# Patient Record
Sex: Female | Born: 1985
Health system: Southern US, Community
[De-identification: ages and names within clinical notes are randomized; demographics above are authoritative.]

## PROBLEM LIST (undated history)

## (undated) DIAGNOSIS — Z789 Other specified health status: Secondary | ICD-10-CM

## (undated) DIAGNOSIS — Z1501 Genetic susceptibility to malignant neoplasm of breast: Secondary | ICD-10-CM

## (undated) DIAGNOSIS — Z15068 Genetic susceptibility to other malignant neoplasm of digestive system: Secondary | ICD-10-CM

## (undated) DIAGNOSIS — T883XXA Malignant hyperthermia due to anesthesia, initial encounter: Secondary | ICD-10-CM

## (undated) HISTORY — DX: Genetic susceptibility to other malignant neoplasm of digestive system: Z15.068

## (undated) HISTORY — DX: Genetic susceptibility to malignant neoplasm of breast: Z15.01

---

## 2010-01-10 ENCOUNTER — Emergency Department (HOSPITAL_COMMUNITY): Admission: EM | Admit: 2010-01-10 | Discharge: 2010-01-10 | Payer: Self-pay | Admitting: Family Medicine

## 2013-05-20 ENCOUNTER — Other Ambulatory Visit: Payer: Self-pay | Admitting: Obstetrics and Gynecology

## 2013-05-20 DIAGNOSIS — Z1501 Genetic susceptibility to malignant neoplasm of breast: Secondary | ICD-10-CM

## 2013-05-20 DIAGNOSIS — Z1509 Genetic susceptibility to other malignant neoplasm: Principal | ICD-10-CM

## 2015-05-31 DIAGNOSIS — Z6830 Body mass index (BMI) 30.0-30.9, adult: Secondary | ICD-10-CM | POA: Diagnosis not present

## 2015-05-31 DIAGNOSIS — Z01419 Encounter for gynecological examination (general) (routine) without abnormal findings: Secondary | ICD-10-CM | POA: Diagnosis not present

## 2015-05-31 DIAGNOSIS — Z124 Encounter for screening for malignant neoplasm of cervix: Secondary | ICD-10-CM | POA: Diagnosis not present

## 2015-05-31 DIAGNOSIS — Z8041 Family history of malignant neoplasm of ovary: Secondary | ICD-10-CM | POA: Diagnosis not present

## 2015-05-31 MED FILL — MONO-LINYAH 28 TABLET: 0.25-35 | 63 days supply | Qty: 84 | Fill #0

## 2015-05-31 MED FILL — SUMATRIPTAN SUCC 100 MG TAB: 100 | 30 days supply | Qty: 9 | Fill #0

## 2015-09-12 MED FILL — MONO-LINYAH 28 TABLET: 0.25-35 | 63 days supply | Qty: 84 | Fill #1

## 2015-09-12 MED FILL — METHYLPREDNISOLONE 4 MG TAB: 4 | 6 days supply | Qty: 21 | Fill #0

## 2015-10-26 DIAGNOSIS — Z1501 Genetic susceptibility to malignant neoplasm of breast: Secondary | ICD-10-CM | POA: Diagnosis not present

## 2015-10-26 DIAGNOSIS — Z1502 Genetic susceptibility to malignant neoplasm of ovary: Secondary | ICD-10-CM | POA: Diagnosis not present

## 2015-10-26 DIAGNOSIS — Z1239 Encounter for other screening for malignant neoplasm of breast: Secondary | ICD-10-CM | POA: Diagnosis not present

## 2015-10-26 DIAGNOSIS — Z1231 Encounter for screening mammogram for malignant neoplasm of breast: Secondary | ICD-10-CM | POA: Diagnosis not present

## 2015-10-26 DIAGNOSIS — R922 Inconclusive mammogram: Secondary | ICD-10-CM | POA: Diagnosis not present

## 2015-10-26 DIAGNOSIS — Z0389 Encounter for observation for other suspected diseases and conditions ruled out: Secondary | ICD-10-CM | POA: Diagnosis not present

## 2015-10-26 DIAGNOSIS — Z803 Family history of malignant neoplasm of breast: Secondary | ICD-10-CM | POA: Diagnosis not present

## 2016-06-10 DIAGNOSIS — N926 Irregular menstruation, unspecified: Secondary | ICD-10-CM | POA: Diagnosis not present

## 2016-06-10 DIAGNOSIS — Z124 Encounter for screening for malignant neoplasm of cervix: Secondary | ICD-10-CM | POA: Diagnosis not present

## 2016-06-10 DIAGNOSIS — Z8041 Family history of malignant neoplasm of ovary: Secondary | ICD-10-CM | POA: Diagnosis not present

## 2016-06-10 DIAGNOSIS — E282 Polycystic ovarian syndrome: Secondary | ICD-10-CM | POA: Diagnosis not present

## 2016-06-10 DIAGNOSIS — Z01419 Encounter for gynecological examination (general) (routine) without abnormal findings: Secondary | ICD-10-CM | POA: Diagnosis not present

## 2016-06-10 MED FILL — CLOMIPHENE CITRATE 50 MG TA: 50 | 84 days supply | Qty: 15 | Fill #0

## 2016-07-02 DIAGNOSIS — N979 Female infertility, unspecified: Secondary | ICD-10-CM | POA: Diagnosis not present

## 2016-07-25 DIAGNOSIS — Z3480 Encounter for supervision of other normal pregnancy, unspecified trimester: Secondary | ICD-10-CM | POA: Diagnosis not present

## 2016-07-25 DIAGNOSIS — N925 Other specified irregular menstruation: Secondary | ICD-10-CM | POA: Diagnosis not present

## 2016-07-25 DIAGNOSIS — Z3201 Encounter for pregnancy test, result positive: Secondary | ICD-10-CM | POA: Diagnosis not present

## 2016-07-25 DIAGNOSIS — Z113 Encounter for screening for infections with a predominantly sexual mode of transmission: Secondary | ICD-10-CM | POA: Diagnosis not present

## 2016-07-25 LAB — OB RESULTS CONSOLE GC/CHLAMYDIA
Chlamydia: NEGATIVE
Gonorrhea: NEGATIVE

## 2016-09-02 DIAGNOSIS — Z3682 Encounter for antenatal screening for nuchal translucency: Secondary | ICD-10-CM | POA: Diagnosis not present

## 2016-09-02 DIAGNOSIS — Z3480 Encounter for supervision of other normal pregnancy, unspecified trimester: Secondary | ICD-10-CM | POA: Diagnosis not present

## 2016-09-02 DIAGNOSIS — O30041 Twin pregnancy, dichorionic/diamniotic, first trimester: Secondary | ICD-10-CM | POA: Diagnosis not present

## 2016-09-02 LAB — OB RESULTS CONSOLE HIV ANTIBODY (ROUTINE TESTING): HIV: NONREACTIVE

## 2016-09-02 LAB — OB RESULTS CONSOLE RPR: RPR: NONREACTIVE

## 2016-09-02 LAB — OB RESULTS CONSOLE ABO/RH: RH Type: NEGATIVE

## 2016-09-02 LAB — OB RESULTS CONSOLE HEPATITIS B SURFACE ANTIGEN: Hepatitis B Surface Ag: NEGATIVE

## 2016-09-02 LAB — OB RESULTS CONSOLE ANTIBODY SCREEN: Antibody Screen: NEGATIVE

## 2016-09-02 MED FILL — PROMETHAZINE 25 MG TABLET: 25 | 7 days supply | Qty: 30 | Fill #0

## 2016-09-02 MED FILL — METOCLOPRAMIDE 10 MG TABLET: 10 | 7 days supply | Qty: 30 | Fill #0

## 2016-09-30 DIAGNOSIS — Z3482 Encounter for supervision of other normal pregnancy, second trimester: Secondary | ICD-10-CM | POA: Diagnosis not present

## 2016-09-30 MED FILL — MOMETASONE FUROATE 50 MCG S: 50 | 30 days supply | Qty: 17 | Fill #0

## 2016-09-30 MED FILL — ONDANSETRON HCL 8 MG TAB: 8 | 10 days supply | Qty: 30 | Fill #0

## 2016-10-17 DIAGNOSIS — Z363 Encounter for antenatal screening for malformations: Secondary | ICD-10-CM | POA: Diagnosis not present

## 2016-10-17 DIAGNOSIS — O30112 Triplet pregnancy with two or more monochorionic fetuses, second trimester: Secondary | ICD-10-CM | POA: Diagnosis not present

## 2016-11-14 DIAGNOSIS — O09521 Supervision of elderly multigravida, first trimester: Secondary | ICD-10-CM | POA: Diagnosis not present

## 2016-11-14 DIAGNOSIS — O30041 Twin pregnancy, dichorionic/diamniotic, first trimester: Secondary | ICD-10-CM | POA: Diagnosis not present

## 2016-11-14 DIAGNOSIS — O359XX1 Maternal care for (suspected) fetal abnormality and damage, unspecified, fetus 1: Secondary | ICD-10-CM | POA: Diagnosis not present

## 2016-11-14 MED FILL — DICLEGIS DR 10-10 MG TABLET: 10-10 | 30 days supply | Qty: 120 | Fill #0

## 2016-12-10 DIAGNOSIS — Z3480 Encounter for supervision of other normal pregnancy, unspecified trimester: Secondary | ICD-10-CM | POA: Diagnosis not present

## 2016-12-10 DIAGNOSIS — O26842 Uterine size-date discrepancy, second trimester: Secondary | ICD-10-CM | POA: Diagnosis not present

## 2016-12-10 DIAGNOSIS — O30042 Twin pregnancy, dichorionic/diamniotic, second trimester: Secondary | ICD-10-CM | POA: Diagnosis not present

## 2016-12-25 DIAGNOSIS — Z369 Encounter for antenatal screening, unspecified: Secondary | ICD-10-CM | POA: Diagnosis not present

## 2016-12-25 DIAGNOSIS — Z23 Encounter for immunization: Secondary | ICD-10-CM | POA: Diagnosis not present

## 2017-01-10 DIAGNOSIS — O30009 Twin pregnancy, unspecified number of placenta and unspecified number of amniotic sacs, unspecified trimester: Secondary | ICD-10-CM | POA: Diagnosis not present

## 2017-01-30 DIAGNOSIS — R8271 Bacteriuria: Secondary | ICD-10-CM | POA: Diagnosis not present

## 2017-01-30 MED FILL — CEFUROXIME AXETIL 500 MG TA: 500 | 7 days supply | Qty: 14 | Fill #0

## 2017-02-02 ENCOUNTER — Inpatient Hospital Stay (HOSPITAL_COMMUNITY): Payer: 59

## 2017-02-02 ENCOUNTER — Inpatient Hospital Stay (HOSPITAL_COMMUNITY)
Admission: AD | Admit: 2017-02-02 | Discharge: 2017-02-06 | DRG: 788 | Disposition: A | Discharge status: Home / Self Care | Payer: 59 | Source: Ambulatory Visit | Attending: Obstetrics and Gynecology | Admitting: Obstetrics and Gynecology

## 2017-02-02 ENCOUNTER — Encounter (HOSPITAL_COMMUNITY): Payer: Self-pay

## 2017-02-02 ENCOUNTER — Other Ambulatory Visit: Payer: Self-pay

## 2017-02-02 DIAGNOSIS — O30043 Twin pregnancy, dichorionic/diamniotic, third trimester: Secondary | ICD-10-CM | POA: Diagnosis present

## 2017-02-02 DIAGNOSIS — O4290 Premature rupture of membranes, unspecified as to length of time between rupture and onset of labor, unspecified weeks of gestation: Secondary | ICD-10-CM

## 2017-02-02 DIAGNOSIS — Z3A33 33 weeks gestation of pregnancy: Secondary | ICD-10-CM | POA: Diagnosis not present

## 2017-02-02 DIAGNOSIS — O42913 Preterm premature rupture of membranes, unspecified as to length of time between rupture and onset of labor, third trimester: Secondary | ICD-10-CM | POA: Diagnosis not present

## 2017-02-02 DIAGNOSIS — O30003 Twin pregnancy, unspecified number of placenta and unspecified number of amniotic sacs, third trimester: Secondary | ICD-10-CM | POA: Diagnosis present

## 2017-02-02 DIAGNOSIS — Z363 Encounter for antenatal screening for malformations: Secondary | ICD-10-CM | POA: Diagnosis not present

## 2017-02-02 DIAGNOSIS — O42013 Preterm premature rupture of membranes, onset of labor within 24 hours of rupture, third trimester: Secondary | ICD-10-CM | POA: Diagnosis not present

## 2017-02-02 LAB — URINALYSIS, ROUTINE W REFLEX MICROSCOPIC
Bilirubin Urine: NEGATIVE
Glucose, UA: NEGATIVE mg/dL
Ketones, ur: 5 mg/dL — AB
Leukocytes, UA: NEGATIVE
Nitrite: NEGATIVE
PROTEIN: 30 mg/dL — AB
SPECIFIC GRAVITY, URINE: 1.01 (ref 1.005–1.030)
pH: 5 (ref 5.0–8.0)

## 2017-02-02 LAB — CBC
HCT: 39.1 % (ref 36.0–46.0)
Hemoglobin: 12.6 g/dL (ref 12.0–15.0)
MCH: 32.6 pg (ref 26.0–34.0)
MCHC: 32.2 g/dL (ref 30.0–36.0)
MCV: 101.3 fL — ABNORMAL HIGH (ref 78.0–100.0)
Platelets: 164 10*3/uL (ref 150–400)
RBC: 3.86 MIL/uL — ABNORMAL LOW (ref 3.87–5.11)
RDW: 16.6 % — AB (ref 11.5–15.5)
WBC: 11.1 10*3/uL — ABNORMAL HIGH (ref 4.0–10.5)

## 2017-02-02 LAB — AMNISURE RUPTURE OF MEMBRANE (ROM) NOT AT ARMC: Amnisure ROM: POSITIVE

## 2017-02-02 LAB — POCT FERN TEST

## 2017-02-02 MED ORDER — AZITHROMYCIN 250 MG PO TABS
1000.0000 mg | ORAL_TABLET | Freq: Once | ORAL | Status: AC
  Administered 2017-02-03: 1000 mg via ORAL
  Filled 2017-02-02: qty 4

## 2017-02-02 MED ORDER — SODIUM CHLORIDE 0.9 % IV SOLN
2.0000 g | Freq: Four times a day (QID) | INTRAVENOUS | Status: DC
  Administered 2017-02-03 (×3): 2 g via INTRAVENOUS
  Filled 2017-02-02 (×5): qty 2000

## 2017-02-02 MED ORDER — BETAMETHASONE SOD PHOS & ACET 6 (3-3) MG/ML IJ SUSP
12.0000 mg | INTRAMUSCULAR | Status: AC
  Administered 2017-02-02 – 2017-02-03 (×2): 12 mg via INTRAMUSCULAR
  Filled 2017-02-02 (×2): qty 2

## 2017-02-02 NOTE — MAU Note (Signed)
Pt felt large gush clear fluid at 2030. Then it continued trickling since then. No bleeding. Reduced FM since then.

## 2017-02-02 NOTE — MAU Note (Signed)
Pt states that she had a large gush of fluid about 30 mins prior to arrival. Fluid has continued to leak out and pt wearing pad. Pt denies contractions, but reports side and back pain on left side since Thursday. Reports good fetal movement today. Pt is pregnant with twins.

## 2017-02-02 NOTE — H&P (Signed)
Pt is a 31 y.o. G1P0 [redacted]w[redacted]d white female with a Di/Di twin preg who presents to the ER c/o possible ROM. In the ER the pt had + fern/+pool/+amniosure. Her is closed but thin and the fetal head is palp. She conceived on clomid. There has been no significant discordance.   Chief Complaint: HPI:  History reviewed. No pertinent past medical history.  History reviewed. No pertinent surgical history.  No family history on file. Social History:  has no tobacco, alcohol, and drug history on file.  Allergies: Not on File  No medications prior to admission.       Blood pressure 127/84, pulse 76, temperature 98.1 F (36.7 C), temperature source Oral, resp. rate 18, height 5\' 1"  (1.549 m), weight 187 lb (84.8 kg). General appearance: alert, cooperative and appears stated age Lungs: clear to auscultation bilaterally Abdomen: soft, non-tender; bowel sounds normal; no masses,  no organomegaly and gravid   No results found for: WBC, HGB, HCT, MCV, PLT  There are no active problems to display for this patient.  IMP/ Di/Di twin preg with PPROM          Plan/ Admit          Betamethasone          Ampicillin          Zithromax          U/S          MFM consult  Olga Millers 02/02/2017, 11:30 PM

## 2017-02-03 ENCOUNTER — Encounter (HOSPITAL_COMMUNITY): Payer: Self-pay | Admitting: *Deleted

## 2017-02-03 ENCOUNTER — Inpatient Hospital Stay (HOSPITAL_COMMUNITY): Payer: 59 | Admitting: Anesthesiology

## 2017-02-03 ENCOUNTER — Encounter (HOSPITAL_COMMUNITY)
Admission: AD | Disposition: A | Discharge status: Home / Self Care | Payer: Self-pay | Source: Ambulatory Visit | Attending: Obstetrics and Gynecology

## 2017-02-03 ENCOUNTER — Inpatient Hospital Stay (HOSPITAL_COMMUNITY): Payer: 59

## 2017-02-03 DIAGNOSIS — O42913 Preterm premature rupture of membranes, unspecified as to length of time between rupture and onset of labor, third trimester: Secondary | ICD-10-CM | POA: Diagnosis present

## 2017-02-03 DIAGNOSIS — Z3A33 33 weeks gestation of pregnancy: Secondary | ICD-10-CM | POA: Diagnosis not present

## 2017-02-03 DIAGNOSIS — Z3482 Encounter for supervision of other normal pregnancy, second trimester: Secondary | ICD-10-CM | POA: Diagnosis not present

## 2017-02-03 DIAGNOSIS — Z3483 Encounter for supervision of other normal pregnancy, third trimester: Secondary | ICD-10-CM | POA: Diagnosis not present

## 2017-02-03 DIAGNOSIS — O30043 Twin pregnancy, dichorionic/diamniotic, third trimester: Secondary | ICD-10-CM | POA: Diagnosis not present

## 2017-02-03 DIAGNOSIS — Z98891 History of uterine scar from previous surgery: Secondary | ICD-10-CM | POA: Insufficient documentation

## 2017-02-03 DIAGNOSIS — O30003 Twin pregnancy, unspecified number of placenta and unspecified number of amniotic sacs, third trimester: Secondary | ICD-10-CM | POA: Diagnosis present

## 2017-02-03 DIAGNOSIS — O368932 Maternal care for other specified fetal problems, third trimester, fetus 2: Secondary | ICD-10-CM | POA: Diagnosis not present

## 2017-02-03 LAB — TYPE AND SCREEN
ABO/RH(D): O POS
Antibody Screen: NEGATIVE

## 2017-02-03 LAB — CBC
HEMATOCRIT: 39.4 % (ref 36.0–46.0)
HEMOGLOBIN: 13 g/dL (ref 12.0–15.0)
MCH: 32.7 pg (ref 26.0–34.0)
MCHC: 33 g/dL (ref 30.0–36.0)
MCV: 99.2 fL (ref 78.0–100.0)
Platelets: 195 10*3/uL (ref 150–400)
RBC: 3.97 MIL/uL (ref 3.87–5.11)
RDW: 16.6 % — ABNORMAL HIGH (ref 11.5–15.5)
WBC: 17 10*3/uL — ABNORMAL HIGH (ref 4.0–10.5)

## 2017-02-03 LAB — HIV ANTIBODY (ROUTINE TESTING W REFLEX): HIV SCREEN 4TH GENERATION: NONREACTIVE

## 2017-02-03 LAB — RPR: RPR Ser Ql: NONREACTIVE

## 2017-02-03 LAB — ABO/RH: ABO/RH(D): O POS

## 2017-02-03 SURGERY — Surgical Case
Anesthesia: Epidural | Wound class: Clean Contaminated

## 2017-02-03 MED ORDER — TERBUTALINE SULFATE 1 MG/ML IJ SOLN: 0.2500 mg | Freq: Once | INTRAMUSCULAR | Status: DC | PRN

## 2017-02-03 MED ORDER — LIDOCAINE HCL (PF) 0.5 % IJ SOLN
INTRAMUSCULAR | Status: DC | PRN
  Administered 2017-02-03: 30 mL

## 2017-02-03 MED ORDER — OXYCODONE-ACETAMINOPHEN 5-325 MG PO TABS: 1.0000 | ORAL_TABLET | ORAL | Status: DC | PRN

## 2017-02-03 MED ORDER — OXYCODONE-ACETAMINOPHEN 5-325 MG PO TABS: 2.0000 | ORAL_TABLET | ORAL | Status: DC | PRN

## 2017-02-03 MED ORDER — EPHEDRINE 5 MG/ML INJ: 10.0000 mg | INTRAVENOUS | Status: DC | PRN

## 2017-02-03 MED ORDER — OXYCODONE HCL 5 MG PO TABS: 5.0000 mg | ORAL_TABLET | Freq: Once | ORAL | Status: DC | PRN

## 2017-02-03 MED ORDER — LACTATED RINGERS IV SOLN
INTRAVENOUS | Status: DC | PRN
  Administered 2017-02-03: 20:00:00 via INTRAVENOUS

## 2017-02-03 MED ORDER — ONDANSETRON HCL 4 MG/2ML IJ SOLN: 4.0000 mg | Freq: Four times a day (QID) | INTRAMUSCULAR | Status: DC | PRN

## 2017-02-03 MED ORDER — OXYCODONE HCL 5 MG/5ML PO SOLN: 5.0000 mg | Freq: Once | ORAL | Status: DC | PRN

## 2017-02-03 MED ORDER — OXYTOCIN BOLUS FROM INFUSION: 500.0000 mL | Freq: Once | INTRAVENOUS | Status: DC

## 2017-02-03 MED ORDER — LIDOCAINE HCL (PF) 1 % IJ SOLN
INTRAMUSCULAR | Status: DC | PRN
  Administered 2017-02-03 (×2): 5 mL via EPIDURAL

## 2017-02-03 MED ORDER — ZOLPIDEM TARTRATE 5 MG PO TABS: 5.0000 mg | ORAL_TABLET | Freq: Every evening | ORAL | Status: DC | PRN

## 2017-02-03 MED ORDER — FENTANYL CITRATE (PF) 100 MCG/2ML IJ SOLN
INTRAMUSCULAR | Status: DC | PRN
Start: 2017-02-03 — End: 2017-02-03
  Administered 2017-02-03: 100 ug via EPIDURAL

## 2017-02-03 MED ORDER — SENNOSIDES-DOCUSATE SODIUM 8.6-50 MG PO TABS
2.0000 | ORAL_TABLET | ORAL | Status: DC
  Administered 2017-02-04 – 2017-02-06 (×3): 2 via ORAL
  Filled 2017-02-03 (×3): qty 2

## 2017-02-03 MED ORDER — LACTATED RINGERS IV SOLN
500.0000 mL | Freq: Once | INTRAVENOUS | Status: AC
  Administered 2017-02-03: 500 mL via INTRAVENOUS

## 2017-02-03 MED ORDER — SIMETHICONE 80 MG PO CHEW
80.0000 mg | CHEWABLE_TABLET | Freq: Three times a day (TID) | ORAL | Status: DC
  Administered 2017-02-04 – 2017-02-06 (×6): 80 mg via ORAL
  Filled 2017-02-03 (×6): qty 1

## 2017-02-03 MED ORDER — HYDROMORPHONE HCL 1 MG/ML IJ SOLN
0.2500 mg | INTRAMUSCULAR | Status: DC | PRN
  Administered 2017-02-03: 0.5 mg via INTRAVENOUS

## 2017-02-03 MED ORDER — SOD CITRATE-CITRIC ACID 500-334 MG/5ML PO SOLN: 30.0000 mL | ORAL | Status: DC | PRN

## 2017-02-03 MED ORDER — DIPHENHYDRAMINE HCL 25 MG PO CAPS: 25.0000 mg | ORAL_CAPSULE | Freq: Four times a day (QID) | ORAL | Status: DC | PRN

## 2017-02-03 MED ORDER — FENTANYL CITRATE (PF) 100 MCG/2ML IJ SOLN
INTRAMUSCULAR | Status: AC
  Filled 2017-02-03: qty 2

## 2017-02-03 MED ORDER — PHENYLEPHRINE 40 MCG/ML (10ML) SYRINGE FOR IV PUSH (FOR BLOOD PRESSURE SUPPORT)
PREFILLED_SYRINGE | INTRAVENOUS | Status: AC
  Filled 2017-02-03: qty 10

## 2017-02-03 MED ORDER — CEFAZOLIN SODIUM-DEXTROSE 2-3 GM-%(50ML) IV SOLR
INTRAVENOUS | Status: AC
  Filled 2017-02-03: qty 50

## 2017-02-03 MED ORDER — COCONUT OIL OIL
1.0000 | TOPICAL_OIL | Status: DC | PRN
Start: 2017-02-03 — End: 2017-02-06

## 2017-02-03 MED ORDER — SIMETHICONE 80 MG PO CHEW
80.0000 mg | CHEWABLE_TABLET | ORAL | Status: DC
  Administered 2017-02-04 – 2017-02-06 (×3): 80 mg via ORAL
  Filled 2017-02-03 (×3): qty 1

## 2017-02-03 MED ORDER — OXYTOCIN 40 UNITS IN LACTATED RINGERS INFUSION - SIMPLE MED
2.5000 [IU]/h | INTRAVENOUS | Status: DC
  Filled 2017-02-03 (×2): qty 1000

## 2017-02-03 MED ORDER — LACTATED RINGERS IV SOLN
INTRAVENOUS | Status: DC
  Administered 2017-02-03 (×2): via INTRAVENOUS

## 2017-02-03 MED ORDER — CEFAZOLIN SODIUM-DEXTROSE 2-3 GM-%(50ML) IV SOLR
INTRAVENOUS | Status: DC | PRN
  Administered 2017-02-03: 2 g via INTRAVENOUS

## 2017-02-03 MED ORDER — DEXAMETHASONE SODIUM PHOSPHATE 4 MG/ML IJ SOLN
INTRAMUSCULAR | Status: AC
Start: 2017-02-03 — End: 2017-02-03
  Filled 2017-02-03: qty 1

## 2017-02-03 MED ORDER — DIBUCAINE 1 % RE OINT: 1.0000 "application " | TOPICAL_OINTMENT | RECTAL | Status: DC | PRN

## 2017-02-03 MED ORDER — MAGNESIUM SULFATE BOLUS VIA INFUSION
4.0000 g | Freq: Once | INTRAVENOUS | Status: AC
  Administered 2017-02-03: 4 g via INTRAVENOUS
  Filled 2017-02-03: qty 500

## 2017-02-03 MED ORDER — IBUPROFEN 600 MG PO TABS
600.0000 mg | ORAL_TABLET | Freq: Four times a day (QID) | ORAL | Status: DC
  Administered 2017-02-04 – 2017-02-06 (×10): 600 mg via ORAL
  Filled 2017-02-03 (×11): qty 1

## 2017-02-03 MED ORDER — OXYTOCIN 40 UNITS IN LACTATED RINGERS INFUSION - SIMPLE MED: 2.5000 [IU]/h | INTRAVENOUS | Status: AC

## 2017-02-03 MED ORDER — SIMETHICONE 80 MG PO CHEW: 80.0000 mg | CHEWABLE_TABLET | ORAL | Status: DC | PRN

## 2017-02-03 MED ORDER — ONDANSETRON HCL 4 MG/2ML IJ SOLN
INTRAMUSCULAR | Status: AC
  Filled 2017-02-03: qty 2

## 2017-02-03 MED ORDER — MISOPROSTOL 200 MCG PO TABS
ORAL_TABLET | ORAL | Status: AC
  Filled 2017-02-03: qty 1

## 2017-02-03 MED ORDER — OXYTOCIN 10 UNIT/ML IJ SOLN
INTRAMUSCULAR | Status: DC | PRN
  Administered 2017-02-03: 40 [IU]

## 2017-02-03 MED ORDER — MISOPROSTOL 200 MCG PO TABS
ORAL_TABLET | ORAL | Status: AC
  Filled 2017-02-03: qty 2

## 2017-02-03 MED ORDER — FENTANYL 2.5 MCG/ML BUPIVACAINE 1/10 % EPIDURAL INFUSION (WH - ANES)
14.0000 mL/h | INTRAMUSCULAR | Status: DC | PRN
  Administered 2017-02-03 (×2): 14 mL/h via EPIDURAL
  Filled 2017-02-03 (×2): qty 100

## 2017-02-03 MED ORDER — TETANUS-DIPHTH-ACELL PERTUSSIS 5-2.5-18.5 LF-MCG/0.5 IM SUSP: 0.5000 mL | Freq: Once | INTRAMUSCULAR | Status: DC

## 2017-02-03 MED ORDER — MAGNESIUM SULFATE 40 G IN LACTATED RINGERS - SIMPLE
2.0000 g/h | INTRAVENOUS | Status: DC
Start: 2017-02-03 — End: 2017-02-03
  Administered 2017-02-03: 2 g/h via INTRAVENOUS
  Filled 2017-02-03: qty 40

## 2017-02-03 MED ORDER — DIPHENHYDRAMINE HCL 50 MG/ML IJ SOLN: 12.5000 mg | INTRAMUSCULAR | Status: DC | PRN

## 2017-02-03 MED ORDER — ACETAMINOPHEN 325 MG PO TABS
650.0000 mg | ORAL_TABLET | ORAL | Status: DC | PRN
  Administered 2017-02-04 – 2017-02-06 (×4): 650 mg via ORAL
  Filled 2017-02-03 (×4): qty 2

## 2017-02-03 MED ORDER — SODIUM CHLORIDE 0.9 % IR SOLN
Status: DC | PRN
  Administered 2017-02-03: 1000 mL

## 2017-02-03 MED ORDER — OXYTOCIN 10 UNIT/ML IJ SOLN
INTRAMUSCULAR | Status: AC
  Filled 2017-02-03: qty 4

## 2017-02-03 MED ORDER — PHENYLEPHRINE HCL 10 MG/ML IJ SOLN
INTRAMUSCULAR | Status: DC | PRN
  Administered 2017-02-03 (×2): 80 ug via INTRAVENOUS

## 2017-02-03 MED ORDER — HYDROMORPHONE HCL 1 MG/ML IJ SOLN
INTRAMUSCULAR | Status: AC
  Filled 2017-02-03: qty 0.5

## 2017-02-03 MED ORDER — MORPHINE SULFATE (PF) 0.5 MG/ML IJ SOLN
INTRAMUSCULAR | Status: AC
  Filled 2017-02-03: qty 10

## 2017-02-03 MED ORDER — OXYTOCIN 40 UNITS IN LACTATED RINGERS INFUSION - SIMPLE MED
1.0000 m[IU]/min | INTRAVENOUS | Status: DC
  Administered 2017-02-03: 2 m[IU]/min via INTRAVENOUS

## 2017-02-03 MED ORDER — ONDANSETRON HCL 4 MG/2ML IJ SOLN
INTRAMUSCULAR | Status: DC | PRN
  Administered 2017-02-03: 4 mg via INTRAVENOUS

## 2017-02-03 MED ORDER — PROMETHAZINE HCL 25 MG/ML IJ SOLN: 6.2500 mg | INTRAMUSCULAR | Status: DC | PRN

## 2017-02-03 MED ORDER — PHENYLEPHRINE 40 MCG/ML (10ML) SYRINGE FOR IV PUSH (FOR BLOOD PRESSURE SUPPORT): 80.0000 ug | PREFILLED_SYRINGE | INTRAVENOUS | Status: DC | PRN

## 2017-02-03 MED ORDER — LIDOCAINE-EPINEPHRINE (PF) 2 %-1:200000 IJ SOLN
INTRAMUSCULAR | Status: DC | PRN
  Administered 2017-02-03 (×4): 5 mL

## 2017-02-03 MED ORDER — MENTHOL 3 MG MT LOZG: 1.0000 | LOZENGE | OROMUCOSAL | Status: DC | PRN

## 2017-02-03 MED ORDER — PHENYLEPHRINE 40 MCG/ML (10ML) SYRINGE FOR IV PUSH (FOR BLOOD PRESSURE SUPPORT)
80.0000 ug | PREFILLED_SYRINGE | INTRAVENOUS | Status: DC | PRN
  Filled 2017-02-03: qty 10

## 2017-02-03 MED ORDER — LACTATED RINGERS IV SOLN
INTRAVENOUS | Status: DC
  Administered 2017-02-04: 125 mL/h via INTRAVENOUS

## 2017-02-03 MED ORDER — WITCH HAZEL-GLYCERIN EX PADS: 1.0000 "application " | MEDICATED_PAD | CUTANEOUS | Status: DC | PRN

## 2017-02-03 MED ORDER — PRENATAL MULTIVITAMIN CH
1.0000 | ORAL_TABLET | Freq: Every day | ORAL | Status: DC
  Administered 2017-02-04 – 2017-02-06 (×3): 1 via ORAL
  Filled 2017-02-03 (×3): qty 1

## 2017-02-03 MED ORDER — BUPIVACAINE-EPINEPHRINE (PF) 0.25% -1:200000 IJ SOLN
INTRAMUSCULAR | Status: AC
  Filled 2017-02-03: qty 30

## 2017-02-03 MED ORDER — LACTATED RINGERS IV SOLN
500.0000 mL | INTRAVENOUS | Status: DC | PRN
  Administered 2017-02-03: 500 mL via INTRAVENOUS

## 2017-02-03 MED ORDER — LIDOCAINE-EPINEPHRINE (PF) 2 %-1:200000 IJ SOLN
INTRAMUSCULAR | Status: AC
  Filled 2017-02-03: qty 20

## 2017-02-03 MED ORDER — MORPHINE SULFATE (PF) 0.5 MG/ML IJ SOLN
INTRAMUSCULAR | Status: DC | PRN
  Administered 2017-02-03: 4 mg via EPIDURAL

## 2017-02-03 MED ORDER — LACTATED RINGERS IV SOLN
INTRAVENOUS | Status: DC | PRN
  Administered 2017-02-03 (×2): via INTRAVENOUS

## 2017-02-03 MED ORDER — ACETAMINOPHEN 325 MG PO TABS: 650.0000 mg | ORAL_TABLET | ORAL | Status: DC | PRN

## 2017-02-03 MED ORDER — LIDOCAINE HCL (PF) 1 % IJ SOLN
30.0000 mL | INTRAMUSCULAR | Status: DC | PRN
Start: 2017-02-03 — End: 2017-02-03
  Filled 2017-02-03: qty 30

## 2017-02-03 SURGICAL SUPPLY — 32 items
BLADE 10 SAFETY STRL DISP (BLADE) ×6 IMPLANT
CHLORAPREP W/TINT 26ML (MISCELLANEOUS) ×3 IMPLANT
CLAMP CORD UMBIL (MISCELLANEOUS) IMPLANT
CLOTH BEACON ORANGE TIMEOUT ST (SAFETY) ×3 IMPLANT
DRSG OPSITE POSTOP 4X10 (GAUZE/BANDAGES/DRESSINGS) ×3 IMPLANT
ELECT REM PT RETURN 9FT ADLT (ELECTROSURGICAL) ×3
ELECTRODE REM PT RTRN 9FT ADLT (ELECTROSURGICAL) ×1 IMPLANT
EXTRACTOR VACUUM M CUP 4 TUBE (SUCTIONS) IMPLANT
EXTRACTOR VACUUM M CUP 4' TUBE (SUCTIONS)
GLOVE BIOGEL PI IND STRL 6.5 (GLOVE) ×1 IMPLANT
GLOVE BIOGEL PI IND STRL 7.0 (GLOVE) ×1 IMPLANT
GLOVE BIOGEL PI INDICATOR 6.5 (GLOVE) ×2
GLOVE BIOGEL PI INDICATOR 7.0 (GLOVE) ×2
GLOVE ECLIPSE 6.5 STRL STRAW (GLOVE) ×3 IMPLANT
GOWN STRL REUS W/TWL LRG LVL3 (GOWN DISPOSABLE) ×6 IMPLANT
KIT ABG SYR 3ML LUER SLIP (SYRINGE) IMPLANT
NEEDLE HYPO 25X5/8 SAFETYGLIDE (NEEDLE) IMPLANT
NS IRRIG 1000ML POUR BTL (IV SOLUTION) ×3 IMPLANT
PACK C SECTION WH (CUSTOM PROCEDURE TRAY) ×3 IMPLANT
PAD ABD 7.5X8 STRL (GAUZE/BANDAGES/DRESSINGS) IMPLANT
PAD OB MATERNITY 4.3X12.25 (PERSONAL CARE ITEMS) ×3 IMPLANT
PENCIL SMOKE EVAC W/HOLSTER (ELECTROSURGICAL) ×3 IMPLANT
RTRCTR C-SECT PINK 25CM LRG (MISCELLANEOUS) ×3 IMPLANT
SUT MON AB 2-0 CT1 27 (SUTURE) ×3 IMPLANT
SUT MON AB 4-0 PS1 27 (SUTURE) IMPLANT
SUT PDS AB 0 CTX 60 (SUTURE) IMPLANT
SUT PLAIN 2 0 XLH (SUTURE) IMPLANT
SUT VIC AB 0 CTX 36 (SUTURE) ×8
SUT VIC AB 0 CTX36XBRD ANBCTRL (SUTURE) ×4 IMPLANT
SUT VIC AB 4-0 KS 27 (SUTURE) IMPLANT
TOWEL OR 17X24 6PK STRL BLUE (TOWEL DISPOSABLE) ×3 IMPLANT
TRAY FOLEY BAG SILVER LF 14FR (SET/KITS/TRAYS/PACK) ×3 IMPLANT

## 2017-02-03 NOTE — Progress Notes (Signed)
No return call 

## 2017-02-03 NOTE — Progress Notes (Signed)
Shakiyla Kook is a 31 y.o. G1P0 at [redacted]w[redacted]d patient of Dr. Rogue Bussing. I was called to assist in OR 2 after the delivery of twin A. Fetal bradycardia of twin B was noted and there was no descent with pushing Subjective:   Objective: BP (!) 87/65   Pulse 89   Temp 97.9 F (36.6 C) (Axillary)   Resp 19   Ht 5\' 1"  (1.549 m)   Wt 84.8 kg (187 lb)   SpO2 96%   BMI 35.33 kg/m  Total I/O In: 2000 [I.V.:2000] Out: 800 [Urine:150; Blood:650]   SVE:   Dilation: 10 Effacement (%): 100 Station: +1 Exam by:: [employer]: Lab Results  Component Value Date   WBC 17.0 (H) 02/03/2017   HGB 13.0 02/03/2017   HCT 39.4 02/03/2017   MCV 99.2 02/03/2017   PLT 195 02/03/2017    Assessment / Plan:  no descent with pushing and Dr. Rogue Bussing and I proceeded with emergency cesarean section due to fetal bradycardia at +1 station, [redacted]w[redacted]d   Emeterio Reeve 02/03/2017, 10:14 PM

## 2017-02-03 NOTE — Transfer of Care (Addendum)
Immediate Anesthesia Transfer of Care Note  Patient: Tiffany Barron  Procedure(s) Performed: TWIN DOUBLE SET UP FOR VAGINAL DELIVERY (N/A ) CESAREAN SECTION (N/A )  Patient Location: PACU  Anesthesia Type:Epidural  Level of Consciousness: awake, alert , oriented and patient cooperative  Airway & Oxygen Therapy: Patient Spontanous Breathing  Post-op Assessment: Report given to RN and Post -op Vital signs reviewed and stable  Post vital signs: Reviewed and stable: 84/59 with NSR of 85; patient asymptomatic; giving a bolus of IVF; MDA called to make aware  Last Vitals:  Vitals:   02/03/17 1720 02/03/17 2004  BP:    Pulse:    Resp:    Temp: 37.4 C 36.6 C  SpO2:      Last Pain:  Vitals:   02/03/17 2004  TempSrc: Oral  PainSc:       Patients Stated Pain Goal: 1 (33/29/[birth year] 8841)  Complications: No apparent anesthesia complications

## 2017-02-03 NOTE — Progress Notes (Signed)
Pt reported feeling increased contraction pain, cervical exam revealed 7cm dilation. Pt was transferred to L&D.  I discussed with her mode of delivery, she would like to proceed with attempt of vaginal delivery knowing about risk of need to convert to cesarean for situation such as if Baby B becomes reach or if fetal heart tones develop abnormalities.  When cervix is complete will transfer patient to OR for delivery.

## 2017-02-03 NOTE — Brief Op Note (Signed)
02/02/2017 - 02/03/2017  8:13 PM  PATIENT:  Tiffany Barron  31 y.o. female  PRE-OPERATIVE DIAGNOSIS:  double set up twin vaginal delivery, 33.[redacted] week gestation  POST-OPERATIVE DIAGNOSIS:  twin delivery, Baby A vaginal, Baby B stat c/s for NRFHT  PROCEDURE:  Procedure(s) with comments: TWIN DOUBLE SET UP FOR VAGINAL DELIVERY (N/A) - twin double set up fo [place] (N/A)  SURGEON:  Surgeon(s) and Role:    * Allyn Kenner, DO - Primary    * Woodroe Mode, MD - Assisting  ANESTHESIA:   epidural  EBL:  650 mL    LOCAL MEDICATIONS USED:  LIDOCAINE  and Amount: 10 ml for Pfannenstiel  SPECIMEN:  Source of Specimen:  placenta and cord blood  DISPOSITION OF SPECIMEN:  PATHOLOGY  PLAN OF CARE: Admit to inpatient   PATIENT DISPOSITION:  PACU - hemodynamically stable.   Delay start of Pharmacological VTE agent (>24hrs) due to surgical blood loss or risk of bleeding: not applicable  See full op note for details

## 2017-02-03 NOTE — Anesthesia Preprocedure Evaluation (Addendum)
Anesthesia Evaluation  Patient identified by MRN, date of birth, ID band Patient awake    Reviewed: Allergy & Precautions, NPO status , Patient's Chart, lab work & pertinent test results  History of Anesthesia Complications (+) MALIGNANT HYPERTHERMIA, Family history of anesthesia reaction and history of anesthetic complications  Airway Mallampati: II  TM Distance: >3 FB     Dental no notable dental hx. (+) Dental Advisory Given   Pulmonary neg pulmonary ROS,    Pulmonary exam normal        Cardiovascular negative cardio ROS Normal cardiovascular exam     Neuro/Psych negative neurological ROS     GI/Hepatic negative GI ROS, Neg liver ROS,   Endo/Other  negative endocrine ROS  Renal/GU negative Renal ROS     Musculoskeletal negative musculoskeletal ROS (+)   Abdominal   Peds  Hematology negative hematology ROS (+)   Anesthesia Other Findings Day of surgery medications reviewed with the patient.  Reproductive/Obstetrics (+) Pregnancy                            Anesthesia Physical Anesthesia Plan  ASA: III  Anesthesia Plan: Epidural   Post-op Pain Management:    Induction:   PONV Risk Score and Plan:   Airway Management Planned: Natural Airway  Additional Equipment:   Intra-op Plan:   Post-operative Plan: Extubation in OR  Informed Consent: I have reviewed the patients History and Physical, chart, labs and discussed the procedure including the risks, benefits and alternatives for the proposed anesthesia with the patient or authorized representative who has indicated his/her understanding and acceptance.   Dental advisory given  Plan Discussed with: CRNA and Anesthesiologist  Anesthesia Plan Comments: (Pts brother had documented [place] episode as a child (in [place]) never tested.  Pt's paternal grandmother also died in OR many years ago.  They believe she may have died from [facility].   Pt's father has not had a GA. )       Anesthesia Quick Evaluation

## 2017-02-03 NOTE — Progress Notes (Signed)
Dr Rogue Bussing called she gave order to check patients cervix.

## 2017-02-03 NOTE — Progress Notes (Signed)
Patient complaining of painful contractions.  Assisted to bathroom by NT Marla and pinkish vaginal discharge noted.  Dr. Ouida Sills paged and left message.    Per amion Dr.Callahan on call now.  Called and updated about patient uterine contractions every 2 min lasting 10 min.  Dr. Rogue Bussing informed this nurse, she is waiting hand over report from Dr. Ouida Sills.    Report given to incoming nurse.    [id number] Patient ready for transfer to L&D per bed.

## 2017-02-03 NOTE — Progress Notes (Signed)
Admission nutrition screen triggered for unintentional weight loss > 10 lbs within the last month. . Patients chart reviewed and assessed  for nutritional risk. Patient is determined to be at low nutrition  risk.    Tiffany Barron M.Fredderick Severance LDN Neonatal Nutrition Support Specialist/RD III Pager [phone]      Phone [phone]

## 2017-02-03 NOTE — Plan of Care (Signed)
Post op care and education reviewed with patient and family. Questions encouraged.

## 2017-02-03 NOTE — Progress Notes (Signed)
Moving to OR 2 for delivery. Louretta Parma RN [phone]

## 2017-02-03 NOTE — Anesthesia Procedure Notes (Signed)
Epidural Patient location during procedure: OB Start time: 02/03/2017 8:18 AM End time: 02/03/2017 9:34 AM  Staffing Anesthesiologist: Duane Boston, MD Performed: anesthesiologist   Preanesthetic Checklist Completed: patient identified, site marked, pre-op evaluation, timeout performed, IV checked, risks and benefits discussed and monitors and equipment checked  Epidural Patient position: sitting Prep: DuraPrep Patient monitoring: heart rate, cardiac monitor, continuous pulse ox and blood pressure Approach: midline Location: L2-L3 Injection technique: LOR saline  Needle:  Needle type: Tuohy  Needle gauge: 17 G Needle length: 9 cm Catheter size: 20 Guage Test dose: negative and Other  Assessment Events: blood not aspirated, injection not painful, no injection resistance and negative IV test  Additional Notes Informed consent obtained prior to proceeding including risk of failure, 1% risk of PDPH, risk of minor discomfort and bruising.  Discussed rare but serious complications including epidural abscess, permanent nerve injury, epidural hematoma.  Discussed alternatives to epidural analgesia and patient desires to proceed.  Timeout performed pre-procedure verifying patient name, procedure, and platelet count.  Patient tolerated procedure well.

## 2017-02-03 NOTE — Progress Notes (Signed)
Order given to transfer to labor and delivery given by Dr Rogue Bussing.

## 2017-02-04 ENCOUNTER — Encounter (HOSPITAL_COMMUNITY): Payer: Self-pay | Admitting: *Deleted

## 2017-02-04 LAB — CBC
HEMATOCRIT: 32.9 % — AB (ref 36.0–46.0)
HEMOGLOBIN: 11 g/dL — AB (ref 12.0–15.0)
MCH: 33.4 pg (ref 26.0–34.0)
MCHC: 33.4 g/dL (ref 30.0–36.0)
MCV: 100 fL (ref 78.0–100.0)
PLATELETS: 168 10*3/uL (ref 150–400)
RBC: 3.29 MIL/uL — AB (ref 3.87–5.11)
RDW: 16.9 % — ABNORMAL HIGH (ref 11.5–15.5)
WBC: 33.3 10*3/uL — AB (ref 4.0–10.5)

## 2017-02-04 MED ORDER — OXYCODONE HCL 5 MG PO TABS
5.0000 mg | ORAL_TABLET | ORAL | Status: DC | PRN
  Administered 2017-02-04 (×2): 5 mg via ORAL
  Filled 2017-02-04 (×2): qty 1

## 2017-02-04 MED ORDER — OXYCODONE HCL 5 MG PO TABS
10.0000 mg | ORAL_TABLET | ORAL | Status: DC | PRN
  Administered 2017-02-05 – 2017-02-06 (×5): 10 mg via ORAL
  Filled 2017-02-04 (×6): qty 2

## 2017-02-04 NOTE — Progress Notes (Signed)
Patient returned from NICU via wheelchair. Attempted to walk on unit. Ambulated about 15 feet and complained of dizziness and weakness. Returned to room and sat on bed. Discussed removing foley catheter, but declined stating, "I'd like to wait until I get a nap first."  Tolerated crackers and juice without nausea.

## 2017-02-04 NOTE — Op Note (Signed)
NAMECUMA, POLYAKOV NO.:  [id number]  MEDICAL RECORD NO.:  [id number]  LOCATION:  4854                          FACILITY:  [facility]  PHYSICIAN:  Allyn Kenner, DO    DATE OF BIRTH:  01-Feb-1986  DATE OF PROCEDURE: DATE OF DISCHARGE:                              OPERATIVE REPORT   PREOPERATIVE DIAGNOSIS:  Desired vaginal delivery of vertex-vertex twin gestation at 36 and 5 days.  POSTOPERATIVE DIAGNOSIS:  Twin delivery, baby A vaginally, baby D stat cesarean, non-reassuring fetal heart tones.  PROCEDURE:  Vaginal delivery, low-transverse cesarean section, and repair of second-degree perineal laceration with 2-0 Vicryl.  SURGEON:  Allyn Kenner, DO.  ASSISTANT:  Emeterio Reeve, MD.  ANESTHESIA:  Epidural and 10 mL of lidocaine at skin incision.  ESTIMATED BLOOD LOSS:  650 mL.  INTRAVENOUS FLUIDS:  Please see Anesthesia report.  URINE OUTPUT:  Please see Anesthesia report.  SPECIMENS:  Placenta x2 and cord blood to Pathology.  COMPLICATIONS:  None.  CONDITION:  Stable to PACU.  DESCRIPTION OF PROCEDURE:  When the patient was found to be complete, she was transferred to the operating room for attempted vaginal delivery.  The patient pushed for approximately 3-1/2 hours with baby A with successful vaginal delivery and a small second-degree laceration. The first hour of pushing was not very adequate.  Pitocin was started to help increase frequency of contractions.  By an hour and a half, her pushing had become effective.  Baby A once delivered was bulb suctioned. Cord was clamped after approximately 30 seconds and the infant was handed off to awaiting Neonatology.  Ultrasound was performed showing baby B vertex.  Fetal heart tones were obtained and found to be in the 150s.  The infant's head within a short interval of time was well applied to the pelvis.  Amniotic bag was palpated and artificial rupture of membranes was performed.  Baby B descended  further into the pelvis and fetal heart rate dropped to 70s-90s. Trial of pushing to see if rapid vaginal delivery could be facilitated was attempted without further descent.  At this time Dr. Roselie Awkward presented and evaluated patient for forceps delivery as I am not trained in forceps.  Forceps were not immediately available and upon further assessment decision to abandon vaginal delivery was made.  2g Ancef was administered while pt was prepped with betadine, draped and assessed for adequate analgesia.  Given history of malignant hyperthermia, then analgesia was not adequate 10cc lidocaine was administered at Pfannenstiel site.  Pt was retested and found to have adequate analgesia.  Scalpel was used to make Pfannenstiel incision and care down to fascia that was incised at the midline.  Fascial incision was extended manually, rectus muscles were separated manually and peritoneum entered bluntly.  Bladder blade was inserted and low transverse uterine incision was made, amniotic sac was entered sharply and infants head was located deep in pelvis, elevated and delivered without difficulty followed by the remainder of the infants body.  The cord was doubly clamped and cut and infant was handed off immediately to awaiting neonatology.  Both placentas were removed by external uterine massage and gentle traction on the cords.  The uterus  was cleared of all clot and debris and the uterine incision was closed with 0-vicryl in a running locked fashion followed by a second layer of vertical imbrication.  Excellent hemostasis was noted.  Ovaries and tubes were visualized and normal.  Anterior abdominal wall layers were examined and found to be hemostatic. Peritoneum was reapproximated and closed with 3-0 monocryl.  Fascia was reapproximated and closed with 0-vicryl.  Subcutaneous tissued was irrigated, dried, and minimal use of bovie cautery was used for control of small bleeders.  Skin was reapproximated and closed with staples.   Pt tolerated the procedure well.  Sponge, lap and needle counts were correct x 2.  Patient was taken to recovery in stable condition.          ______________________________ Allyn Kenner, DO     [place]/MEDQ  D:  02/04/2017  T:  02/04/2017  Job:  [id number]

## 2017-02-04 NOTE — Progress Notes (Signed)
Subjective: Postpartum Day 1: SVD & Cesarean Delivery Twins Patient reports incisional pain, tolerating PO and + flatus.    Objective: Vital signs in last 24 hours: Temp:  [97.9 F (36.6 C)-99.4 F (37.4 C)] 98.3 F (36.8 C) (11/20 0815) Pulse Rate:  [77-211] 77 (11/20 0815) Resp:  [15-24] 18 (11/20 0815) BP: (84-188)/(42-160) 112/71 (11/20 0815) SpO2:  [91 %-99 %] 98 % (11/20 0815)  Physical Exam:  General: alert, cooperative and no distress Lochia: appropriate Uterine Fundus: firm Abd: soft, appropriately tender, +distended Incision: healing well, no dehiscence, slight serosanguinous drainage DVT Evaluation: No evidence of DVT seen on physical exam. Negative Homan's sign. No cords or calf tenderness. No significant calf/ankle edema.  Recent Labs    02/03/17 0743 02/04/17 0540  HGB 13.0 11.0*  HCT 39.4 32.9*    Assessment/Plan: Status post Cesarean section. Doing well postoperatively.  Continue current care. Change honeycomb Babies in NICU Encouraged ambulation D/C foley Saline lock IV  Shereta Crothers STACIA 02/04/2017, 10:05 AM

## 2017-02-04 NOTE — Progress Notes (Signed)
Oxygen sensor alarming at 90% occationaly. Oxygen administered by nasal cannula at 2 l/m. Sat immediately improved to 98 %. Pt use IS and Oxygen redused to 1 l/m with sats still >96%. Pt denies discomfort or SOB.

## 2017-02-04 NOTE — Anesthesia Postprocedure Evaluation (Signed)
Anesthesia Post Note  Patient: Tiffany Barron  Procedure(s) Performed: TWIN DOUBLE SET UP FOR VAGINAL DELIVERY (N/A ) CESAREAN SECTION (N/A )     Patient location during evaluation: [facility] Anesthesia Type: Epidural Level of consciousness: awake and alert Pain management: pain level controlled Vital Signs Assessment: post-procedure vital signs reviewed and stable Respiratory status: spontaneous breathing, nonlabored ventilation and respiratory function stable Cardiovascular status: stable Postop Assessment: no headache, no backache and epidural receding Anesthetic complications: no    Last Vitals:  Vitals:   02/04/17 0600 02/04/17 0649  BP:    Pulse:    Resp:    Temp:    SpO2: 98% 96%    Last Pain:  Vitals:   02/04/17 0620  TempSrc:   PainSc: Asleep   Pain Goal: Patients Stated Pain Goal: 2 (02/04/17 0554)               Gilmer Mor

## 2017-02-05 NOTE — Progress Notes (Signed)
CSW met with MOB to offer support due to premature delivery and NICU admission.  MOB appears to be in good spirits and receptive to CSW's visit.  MOB is known to CSW due to MOB's role as a CSW also.  MOB's chart not reviewed by CSW, but ongoing support available if desired.  MOB has a great support system and states she feels she has been well updated by the medical team.  She shared her birth story and states feeling well cared for.  CSW asked her not to hesitate to call if there is anything we can do to support her while her babies are in the NICU and encouraged her to allow herself to be emotional as she is entitled to all of the emotions she is experiencing related to this experience of becoming a mother.

## 2017-02-05 NOTE — Progress Notes (Signed)
Patient is doing well.  She is ambulating, voiding, tolerating PO.  Pain control is good.  Lochia is appropriate  Vitals:   02/05/17 0001 02/05/17 0455 02/05/17 0704 02/05/17 0810  BP: (!) 104/51 106/60  119/73  Pulse: 81 78  76  Resp: 18 17 15 16   Temp: 98.3 F (36.8 C) 97.7 F (36.5 C)  98.1 F (36.7 C)  TempSrc: Oral Oral  Oral  SpO2: 99% 99%  100%  Weight:      Height:        NAD Abd Fundus firm.  Pressure dressing in place--old serous drainage on dressing has not expanded beyond marked area Ext: trace edema b/l  Lab Results  Component Value Date   WBC 33.3 (H) 02/04/2017   HGB 11.0 (L) 02/04/2017   HCT 32.9 (L) 02/04/2017   MCV 100.0 02/04/2017   PLT 168 02/04/2017    --/--/O POS, O POS (11/19 0743)/RImmune  A/P 31 y.o. [id number] PPD#2/ POD#2 s/p SVD A / c/s of B at 33 wks. Routine care.   53 in NICU--one baby with hypothermia of unknown etiology on vent, other doing well Anticipate discharge tomorrow Remove pressure dressing after shower today  [place]

## 2017-02-05 NOTE — Lactation Note (Signed)
This note was copied from a baby's chart. Lactation Consultation Note  Patient Name: Tiffany Barron [id number] Date: 02/05/2017 Reason for consult: Initial assessment;NICU baby;Multiple gestation  NICU twins 81 hours old. Mom states that her pump is not working correctly. Demonstrated to mom how to turn the white membrane and the yellow center housing piece pointing outward--and pumping working properly. Enc mom to pump every 2-3 hours for a total of 8-12 times/24 hours followed by hand expression. Mom able to collect several ml of colostrum, and FOB stated that he is going to take to NICU. Discussed progression of milk coming to volume and EBM storage guidelines, and mom aware of pumping rooms in NICU. Mom states that she has ordered a Spectra pump. Enc mom to take Medela pumping kit at D/C to use in NICU pumping rooms.  Maternal Data Has patient been taught Hand Expression?: Yes Does the patient have breastfeeding experience prior to this delivery?: No  Feeding    LATCH Score                   Interventions Interventions: Breast feeding basics reviewed;Hand express;Expressed milk  Lactation Tools Discussed/Used Tools: Pump Breast pump type: Double-Electric Breast Pump WIC Program: No Pump Review: Setup, frequency, and cleaning;Milk Storage Initiated by:: bedside RN Date initiated:: 02/03/17   Consult Status Consult Status: Follow-up Date: 02/06/17 Follow-up type: In-patient    Andres Labrum 02/05/2017, 4:00 PM

## 2017-02-06 MED ORDER — OXYCODONE HCL 5 MG PO TABS: 5.0000 mg | ORAL_TABLET | ORAL | 0 refills | Status: DC | PRN

## 2017-02-06 NOTE — Progress Notes (Signed)
POD#3 Pt doing well. Ready for discharge.Will send home.

## 2017-02-06 NOTE — Lactation Note (Signed)
This note was copied from a baby's chart. Lactation Consultation Note: mom getting ready to pump as I went into room. Is having some trouble with suction on one side. Readjusted pump pieces and working only slightly better. Nipples pink but intact- has been turning suction up to get more milk. Reports breasts feel a little different this morning.  Probable DC today.Marland Kitchen Has Spectra pump from insurance company on the way, Offered pump rental from [place]. Will think about it and may borrow pump from friend until her pump comes. Pumped 7 times yesterday and has already pumped 3 times since midnight. Reviewed bringing pump pieces from home and pumping when visiting babies in NICU. No further questions at present. To call prn  Patient Name: Tiffany Barron [id number] Date: 02/06/2017 Reason for consult: Follow-up assessment;Preterm <34wks;NICU baby   Maternal Data Formula Feeding for Exclusion: No Has patient been taught Hand Expression?: Yes Does the patient have breastfeeding experience prior to this delivery?: No  Feeding    LATCH Score                   Interventions    Lactation Tools Discussed/Used WIC Program: No Pump Review: Setup, frequency, and cleaning   Consult Status      Truddie Crumble 02/06/2017, 9:02 AM

## 2017-02-06 NOTE — Discharge Summary (Signed)
Obstetric Discharge Summary Reason for Admission: rupture of membranes Prenatal Procedures: NST and ultrasound Intrapartum Procedures: spontaneous vaginal delivery and cesarean: low cervical, transverse Postpartum Procedures: none Complications-Operative and Postpartum: none Hemoglobin  Date Value Ref Range Status  02/04/2017 11.0 (L) 12.0 - 15.0 g/dL Final   HCT  Date Value Ref Range Status  02/04/2017 32.9 (L) 36.0 - 46.0 % Final    Physical Exam:  General: alert, cooperative and appears stated age 31: appropriate Uterine Fundus: firm Incision: healing well DVT Evaluation: No evidence of DVT seen on physical exam.  Discharge Diagnoses: Twin preg and PPROM and PTL and fetal distress  Discharge Information: Date: 02/06/2017 Activity: pelvic rest Diet: routine Medications: PNV, Ibuprofen and Percocet Condition: stable Instructions: refer to practice specific booklet Discharge to: home Follow-up Information    Allyn Kenner, DO. Schedule an appointment as soon as possible for a visit in 1 month(s).   Specialty:  Obstetrics and Gynecology Contact information: [address] [place] [address] [phone]           Newborn Data:   Madeleyn, Schwimmer [[id number]]  Live born female  Birth Weight: 4 lb 6.2 oz (1990 g) APGAR: 5, 8  Newborn Delivery   Birth date/time:  02/03/2017 18:36:00 Delivery type:  Vaginal, Spontaneous      Shirla, Hodgkiss [[id number]]  Live born female  Birth Weight: 3 lb 6 oz (1530 g) APGAR: 3, 6  Newborn Delivery   Birth date/time:  02/03/2017 18:58:00 Delivery type:  C-Section, Low Transverse C-section categorization:  Primary     Home with in NICU.  Sheyanne Munley E 02/06/2017, 11:11 AM

## 2017-02-09 ENCOUNTER — Ambulatory Visit: Payer: Self-pay

## 2017-02-09 NOTE — Lactation Note (Signed)
This note was copied from a baby's chart. Lactation Consultation Note  Patient Name: Tiffany Barron Date: 02/09/2017 Reason for consult: Follow-up assessment;NICU baby Mom is pumping every 2 hours and obtaining 15 mls per side.  Babies are 35 days old.  Recommended mom allows 3-4 hours for rest at night. Discussed importance of eating well and drinking fluids.  Instructed to relax with pumping and using good breast massage and compression. Assisted with positioning baby at breast for first time.  Baby placed skin to skin in football hold.  Hand expression done but no milk seen.  Baby attempted but unable to grasp breast.  Mom has short nipples.  16 mm nipple shield applied and baby latched and fed actively.  Milk seen in shield. Discussed preterm feeding behaviors.  Encouraged to try baby at breast a few times per day.  Maternal Data    Feeding Feeding Type: Breast Fed Nipple Type: Slow - flow Length of feed: 20 min  LATCH Score Latch: Grasps breast easily, tongue down, lips flanged, rhythmical sucking.  Audible Swallowing: A few with stimulation  Type of Nipple: Everted at rest and after stimulation  Comfort (Breast/Nipple): Soft / non-tender  Hold (Positioning): Assistance needed to correctly position infant at breast and maintain latch.  LATCH Score: 8  Interventions Interventions: Breast feeding basics reviewed;Assisted with latch;Breast compression;Skin to skin;Adjust position;Breast massage;Support pillows;Hand express;Position options  Lactation Tools Discussed/Used Tools: Nipple Shields Nipple shield size: 16   Consult Status Consult Status: PRN    Ave Filter 02/09/2017, 3:00 PM

## 2017-02-21 ENCOUNTER — Ambulatory Visit: Payer: Self-pay

## 2017-02-21 NOTE — Lactation Note (Signed)
This note was copied from a baby's chart. Lactation Consultation Note  Patient Name: Tiffany Barron [id number]'[id number] Date: 02/21/2017  Observed baby at the breast using a 16 mm nipple shield. Baby actively suckling off and on and nipple shield full of milk.  Mom is pumping 7-8 times in 24 hours and has an abundant milk supply.  She states [name] is staying on the breast for 20-25 minutes.  Discussed a pre and post weight next week sometime.   Maternal Data    Feeding Feeding Type: Breast Milk Length of feed: 60 min  LATCH Score Latch: Grasps breast easily, tongue down, lips flanged, rhythmical sucking.  Audible Swallowing: Spontaneous and intermittent  Type of Nipple: Everted at rest and after stimulation  Comfort (Breast/Nipple): Soft / non-tender  Hold (Positioning): No assistance needed to correctly position infant at breast.  LATCH Score: 10  Interventions Interventions: Breast feeding basics reviewed(nipple shield)  Lactation Tools Discussed/Used     Consult Status      Ave Filter 02/21/2017, 5:32 PM

## 2017-03-11 ENCOUNTER — Ambulatory Visit: Payer: Self-pay

## 2017-03-11 NOTE — Lactation Note (Signed)
This note was copied from a baby's chart. Lactation Consultation Note  Patient Name: Tiffany Barron [id number] Date: 03/11/2017   Follow up with mom of 61 week old twins. Twin B is at home and Twin A is working on bottle feeding all of her feedings. Mom reports she is pumping and it is going well. Mom reports she is not BF Twin A much as she wants to know how much she is getting and wants to get her home.   Mom asked when to know when to go up on Twin B's feedings. Enc mom to follow infant feeding cues and if infant still rooting post feeding to add 10-15 cc/ feeding. Mom reports both infants stool with each feeding.      Maternal Data    Feeding Feeding Type: Breast Milk Nipple Type: Dr. Myra Gianotti [facility]  [facility] Score                   Interventions    Lactation Tools Discussed/Used     Consult Status      Donn Pierini 03/11/2017, 4:31 PM

## 2017-04-24 DIAGNOSIS — J111 Influenza due to unidentified influenza virus with other respiratory manifestations: Secondary | ICD-10-CM | POA: Diagnosis not present

## 2017-04-24 MED FILL — OSELTAMIVIR PHOSPHATE 75 MG: 75 | 5 days supply | Qty: 10 | Fill #0

## 2017-05-15 DIAGNOSIS — Z3483 Encounter for supervision of other normal pregnancy, third trimester: Secondary | ICD-10-CM | POA: Diagnosis not present

## 2017-05-15 DIAGNOSIS — Z3482 Encounter for supervision of other normal pregnancy, second trimester: Secondary | ICD-10-CM | POA: Diagnosis not present

## 2017-07-09 DIAGNOSIS — Z3482 Encounter for supervision of other normal pregnancy, second trimester: Secondary | ICD-10-CM | POA: Diagnosis not present

## 2017-07-09 DIAGNOSIS — Z3483 Encounter for supervision of other normal pregnancy, third trimester: Secondary | ICD-10-CM | POA: Diagnosis not present

## 2017-07-29 DIAGNOSIS — Z124 Encounter for screening for malignant neoplasm of cervix: Secondary | ICD-10-CM | POA: Diagnosis not present

## 2017-07-29 DIAGNOSIS — Z01419 Encounter for gynecological examination (general) (routine) without abnormal findings: Secondary | ICD-10-CM | POA: Diagnosis not present

## 2017-07-29 DIAGNOSIS — Z1501 Genetic susceptibility to malignant neoplasm of breast: Secondary | ICD-10-CM | POA: Diagnosis not present

## 2017-08-07 ENCOUNTER — Ambulatory Visit (INDEPENDENT_AMBULATORY_CARE_PROVIDER_SITE_OTHER): Payer: Self-pay | Admitting: Nurse Practitioner

## 2017-08-07 VITALS — BP 90/65 | HR 78 | Temp 98.3°F | Resp 16 | Wt 146.4 lb

## 2017-08-07 DIAGNOSIS — L237 Allergic contact dermatitis due to plants, except food: Secondary | ICD-10-CM

## 2017-08-07 MED ORDER — CLOBETASOL PROPIONATE 0.05 % EX CREA: 1.0000 "application " | TOPICAL_CREAM | Freq: Two times a day (BID) | CUTANEOUS | 0 refills | Status: DC

## 2017-08-07 MED FILL — CLOBETASOL 0.05% CREAM: 0.05 | 20 days supply | Qty: 60 | Fill #0

## 2017-08-07 MED FILL — predniSONE 10 MG TABS: 10 | 20 days supply | Qty: 50 | Fill #0

## 2017-08-07 NOTE — Patient Instructions (Signed)
Poison Ivy Dermatitis Poison ivy dermatitis is inflammation of the skin that is caused by the allergens on the leaves of the poison ivy plant. The skin reaction often involves redness, swelling, blisters, and extreme itching. What are the causes? This condition is caused by a specific chemical (urushiol) found in the sap of the poison ivy plant. This chemical is sticky and can be easily spread to people, animals, and objects. You can get poison ivy dermatitis by:  Having direct contact with a poison ivy plant.  Touching animals, other people, or objects that have come in contact with poison ivy and have the chemical on them.  What increases the risk? This condition is more likely to develop in:  People who are outdoors often.  People who go outdoors without wearing protective clothing, such as closed shoes, long pants, and a long-sleeved shirt.  What are the signs or symptoms? Symptoms of this condition include:  Redness and itching.  A rash that often includes bumps and blisters. The rash usually appears 48 hours after exposure.  Swelling. This may occur if the reaction is more severe.  Symptoms usually last for 1-2 weeks. However, the first time you develop this condition, symptoms may last 3-4 weeks. How is this diagnosed? This condition may be diagnosed based on your symptoms and a physical exam. Your health care provider may also ask you about any recent outdoor activity. How is this treated? Treatment for this condition will vary depending on how severe it is. Treatment may include:  Hydrocortisone creams or calamine lotions to relieve itching.  Oatmeal baths to soothe the skin.  Over-the-counter antihistamine tablets.  Oral steroid medicine for more severe outbreaks.  Follow these instructions at home:  Take or apply over-the-counter and prescription medicines only as told by your health care provider.  Wash exposed skin as soon as possible with soap and cold  water.  Use hydrocortisone creams or calamine lotion as needed to soothe the skin and relieve itching.  Take oatmeal baths as needed. Use colloidal oatmeal. You can get this at your local pharmacy or grocery store. Follow the instructions on the packaging.  Do not scratch or rub your skin.  While you have the rash, wash clothes right after you wear them.  Take Benadryl 25mg  at bedtime for itching.   Use Aveeno Oatmeal Colloidal Bath for itching.  Follow up if rash is not improving or spreading. How is this prevented?  Learn to identify the poison ivy plant and avoid contact with the plant. This plant can be recognized by the number of leaves. Generally, poison ivy has three leaves with flowering branches on a single stem. The leaves are typically glossy, and they have jagged edges that come to a point at the front.  If you have been exposed to poison ivy, thoroughly wash with soap and water right away. You have about 30 minutes to remove the plant resin before it will cause the rash. Be sure to wash under your fingernails because any plant resin there will continue to spread the rash.  When hiking or camping, wear clothes that will help you to avoid exposure on the skin. This includes long pants, a long-sleeved shirt, tall socks, and hiking boots. You can also apply preventive lotion to your skin to help limit exposure.  If you suspect that your clothes or outdoor gear came in contact with poison ivy, rinse them off outside with a garden hose before you bring them inside your house. Contact a health  care provider if:  You have open sores in the rash area.  You have more redness, swelling, or pain in the affected area.  You have redness that spreads beyond the rash area.  You have fluid, blood, or pus coming from the affected area.  You have a fever.  You have a rash over a large area of your body.  You have a rash on your eyes, mouth, or genitals.  Your rash does not improve  after a few days. Get help right away if:  Your face swells or your eyes swell shut.  You have trouble breathing.  You have trouble swallowing. This information is not intended to replace advice given to you by your health care provider. Make sure you discuss any questions you have with your health care provider. Document Released: 03/01/2000 Document Revised: 08/10/2015 Document Reviewed: 08/10/2014 Elsevier Interactive Patient Education  [employer].

## 2017-08-07 NOTE — Progress Notes (Signed)
Subjective:     Tiffany Barron is a 32 y.o. female who presents for evaluation of a rash involving the chest and face, neck and bilateral arms. Rash started 5 days ago. Lesions are erythematous, and raised and blistering in texture. Rash has changed over time. Rash is pruritic. Associated symptoms: none. Patient denies: abdominal pain, cough, decrease in appetite, decrease in energy level, fever, headache, myalgia and vomiting. Patient has had contacts with similar rash. Patient has had new exposures (soaps, lotions, laundry detergents, foods, medications, plants, insects or animals).  Patient states she was out working in the yard pulling weeds, and her husband informed her that she was pulling up poison ivy.  The patient has not taken any medications or used any creams for her rash.  The patient does have twins and is breast-feeding.  The following portions of the patient's history were reviewed and updated as appropriate: allergies, current medications and past medical history.  Review of Systems Constitutional: negative Eyes: positive for right upper eyelid swelling, negative for irritation, redness and visual disturbance Ears, nose, mouth, throat, and face: negative Respiratory: negative Cardiovascular: negative Integument/breast: positive for pruritus, rash and skin color change    Objective:    BP 90/65 (BP Location: Right Arm, Patient Position: Sitting, Cuff Size: Normal)   Pulse 78   Temp 98.3 F (36.8 C) (Oral)   Resp 16   Wt 146 lb 6.4 oz (66.4 kg)   SpO2 98%   BMI 27.66 kg/m  General:  alert, cooperative and no distress  Skin:  erythema noted on chest, bilateral upper extremities, neck and face and papules noted on chest, bilateral upper extremities, neck and face     Assessment:    contact dermatitis: plants Poison Ivy    Plan:   1.  Clobetasol propionate 0.05% cream, apply to the affected areas twice daily for 10 days or until symptoms improve 2.  Benadryl 25 mg  tablet at bedtime for itching. 3.  Aveeno oatmeal colloidal bath for itching. 4.  Discussed with patient at length regarding the use of the topical steroid versus the oral steroid.  Since patient is breast-feeding twins she has not had enough milk stored for both to start prednisone.  Instructed patient that if her symptoms do not improve in the next 48 to 72 hours, I would like her to return and and consider oral steroids for her symptoms. 5.  Patient education provided. 6.  Patient verbalizes understanding and has no questions at time of discharge.

## 2017-09-14 DIAGNOSIS — Z3483 Encounter for supervision of other normal pregnancy, third trimester: Secondary | ICD-10-CM | POA: Diagnosis not present

## 2017-09-14 DIAGNOSIS — Z3482 Encounter for supervision of other normal pregnancy, second trimester: Secondary | ICD-10-CM | POA: Diagnosis not present

## 2017-11-14 DIAGNOSIS — Z3483 Encounter for supervision of other normal pregnancy, third trimester: Secondary | ICD-10-CM | POA: Diagnosis not present

## 2017-11-14 DIAGNOSIS — Z3482 Encounter for supervision of other normal pregnancy, second trimester: Secondary | ICD-10-CM | POA: Diagnosis not present

## 2018-02-25 MED FILL — PROMETHAZINE 25 MG TABLET: 25 | 7 days supply | Qty: 30 | Fill #0

## 2018-02-25 MED FILL — CEFUROXIME AXETIL 500 MG TA: 500 | 7 days supply | Qty: 14 | Fill #0

## 2018-04-13 MED FILL — AMOXICILLIN 500 MG CAPSULE: 500 | 7 days supply | Qty: 21 | Fill #0

## 2018-07-13 MED FILL — AMOXICILLIN 500 MG CAPSULE: 500 | 7 days supply | Qty: 21 | Fill #0

## 2018-10-14 DIAGNOSIS — Z124 Encounter for screening for malignant neoplasm of cervix: Secondary | ICD-10-CM | POA: Diagnosis not present

## 2018-10-14 DIAGNOSIS — Z01419 Encounter for gynecological examination (general) (routine) without abnormal findings: Secondary | ICD-10-CM | POA: Diagnosis not present

## 2018-10-14 DIAGNOSIS — Z1501 Genetic susceptibility to malignant neoplasm of breast: Secondary | ICD-10-CM | POA: Diagnosis not present

## 2018-10-14 DIAGNOSIS — Z1273 Encounter for screening for malignant neoplasm of ovary: Secondary | ICD-10-CM | POA: Diagnosis not present

## 2018-10-14 MED FILL — FEMYNOR 0.25-35 MG-MCG TABS: 0.25-35 | 84 days supply | Qty: 112 | Fill #0

## 2018-10-27 MED FILL — NEXPLANON 68 MG IMPLANT: 68 | 90 days supply | Qty: 1 | Fill #0

## 2018-11-04 MED FILL — AMOXICILLIN 500 MG CAPSULE: 500 | 7 days supply | Qty: 28 | Fill #0

## 2019-02-08 DIAGNOSIS — L570 Actinic keratosis: Secondary | ICD-10-CM | POA: Diagnosis not present

## 2019-02-08 DIAGNOSIS — L814 Other melanin hyperpigmentation: Secondary | ICD-10-CM | POA: Diagnosis not present

## 2019-02-08 DIAGNOSIS — D2362 Other benign neoplasm of skin of left upper limb, including shoulder: Secondary | ICD-10-CM | POA: Diagnosis not present

## 2019-02-08 DIAGNOSIS — L812 Freckles: Secondary | ICD-10-CM | POA: Diagnosis not present

## 2019-02-16 MED FILL — NORGESTIMATE-ETH ESTRADIOL: 0.25-35 | 84 days supply | Qty: 112 | Fill #0

## 2019-03-27 MED FILL — LIDOCAINE 2% VISCOUS SOLN: 2 | 2 days supply | Qty: 60 | Fill #0

## 2019-05-12 DIAGNOSIS — Z803 Family history of malignant neoplasm of breast: Secondary | ICD-10-CM | POA: Diagnosis not present

## 2019-05-12 DIAGNOSIS — Z1239 Encounter for other screening for malignant neoplasm of breast: Secondary | ICD-10-CM | POA: Diagnosis not present

## 2019-05-12 DIAGNOSIS — Z1502 Genetic susceptibility to malignant neoplasm of ovary: Secondary | ICD-10-CM | POA: Diagnosis not present

## 2019-05-12 DIAGNOSIS — Z1501 Genetic susceptibility to malignant neoplasm of breast: Secondary | ICD-10-CM | POA: Diagnosis not present

## 2019-05-12 DIAGNOSIS — Z8041 Family history of malignant neoplasm of ovary: Secondary | ICD-10-CM | POA: Diagnosis not present

## 2019-05-12 DIAGNOSIS — Z1231 Encounter for screening mammogram for malignant neoplasm of breast: Secondary | ICD-10-CM | POA: Diagnosis not present

## 2019-06-02 DIAGNOSIS — Z1501 Genetic susceptibility to malignant neoplasm of breast: Secondary | ICD-10-CM | POA: Diagnosis not present

## 2019-06-02 DIAGNOSIS — Z1502 Genetic susceptibility to malignant neoplasm of ovary: Secondary | ICD-10-CM | POA: Diagnosis not present

## 2019-06-02 DIAGNOSIS — Z1509 Genetic susceptibility to other malignant neoplasm: Secondary | ICD-10-CM | POA: Diagnosis not present

## 2019-06-02 DIAGNOSIS — R928 Other abnormal and inconclusive findings on diagnostic imaging of breast: Secondary | ICD-10-CM | POA: Diagnosis not present

## 2019-06-22 DIAGNOSIS — Z803 Family history of malignant neoplasm of breast: Secondary | ICD-10-CM | POA: Diagnosis not present

## 2019-06-22 DIAGNOSIS — Z1501 Genetic susceptibility to malignant neoplasm of breast: Secondary | ICD-10-CM | POA: Insufficient documentation

## 2019-06-22 DIAGNOSIS — Z8041 Family history of malignant neoplasm of ovary: Secondary | ICD-10-CM | POA: Diagnosis not present

## 2019-06-22 DIAGNOSIS — Z1502 Genetic susceptibility to malignant neoplasm of ovary: Secondary | ICD-10-CM | POA: Diagnosis not present

## 2019-06-22 DIAGNOSIS — Z1509 Genetic susceptibility to other malignant neoplasm: Secondary | ICD-10-CM | POA: Diagnosis not present

## 2019-07-31 DIAGNOSIS — J029 Acute pharyngitis, unspecified: Secondary | ICD-10-CM | POA: Diagnosis not present

## 2019-08-20 DIAGNOSIS — L570 Actinic keratosis: Secondary | ICD-10-CM | POA: Diagnosis not present

## 2019-09-10 DIAGNOSIS — Z3201 Encounter for pregnancy test, result positive: Secondary | ICD-10-CM | POA: Diagnosis not present

## 2019-09-10 DIAGNOSIS — Z113 Encounter for screening for infections with a predominantly sexual mode of transmission: Secondary | ICD-10-CM | POA: Diagnosis not present

## 2019-09-10 DIAGNOSIS — Z3480 Encounter for supervision of other normal pregnancy, unspecified trimester: Secondary | ICD-10-CM | POA: Diagnosis not present

## 2019-09-10 DIAGNOSIS — N925 Other specified irregular menstruation: Secondary | ICD-10-CM | POA: Diagnosis not present

## 2019-09-21 DIAGNOSIS — O3680X9 Pregnancy with inconclusive fetal viability, other fetus: Secondary | ICD-10-CM | POA: Diagnosis not present

## 2019-10-14 DIAGNOSIS — Z3481 Encounter for supervision of other normal pregnancy, first trimester: Secondary | ICD-10-CM | POA: Diagnosis not present

## 2019-10-14 DIAGNOSIS — Z369 Encounter for antenatal screening, unspecified: Secondary | ICD-10-CM | POA: Diagnosis not present

## 2019-10-14 DIAGNOSIS — Z3480 Encounter for supervision of other normal pregnancy, unspecified trimester: Secondary | ICD-10-CM | POA: Diagnosis not present

## 2019-10-14 LAB — OB RESULTS CONSOLE GC/CHLAMYDIA
Chlamydia: NEGATIVE
Gonorrhea: NEGATIVE

## 2019-10-14 LAB — OB RESULTS CONSOLE HIV ANTIBODY (ROUTINE TESTING): HIV: NONREACTIVE

## 2019-10-14 LAB — OB RESULTS CONSOLE HEPATITIS B SURFACE ANTIGEN: Hepatitis B Surface Ag: NEGATIVE

## 2019-10-14 LAB — OB RESULTS CONSOLE RUBELLA ANTIBODY, IGM: Rubella: IMMUNE

## 2019-10-14 LAB — OB RESULTS CONSOLE RPR: RPR: NONREACTIVE

## 2019-10-14 LAB — OB RESULTS CONSOLE ANTIBODY SCREEN: Antibody Screen: NEGATIVE

## 2019-11-03 ENCOUNTER — Other Ambulatory Visit: Payer: Self-pay

## 2019-11-03 ENCOUNTER — Ambulatory Visit
Admission: RE | Admit: 2019-11-03 | Discharge: 2019-11-03 | Disposition: A | Discharge status: Home / Self Care | Payer: 59 | Source: Ambulatory Visit | Attending: Emergency Medicine | Admitting: Emergency Medicine

## 2019-11-03 VITALS — BP 103/60 | HR 102 | Temp 98.2°F | Resp 18

## 2019-11-03 DIAGNOSIS — J069 Acute upper respiratory infection, unspecified: Secondary | ICD-10-CM

## 2019-11-03 LAB — POCT RAPID STREP A (OFFICE): Rapid Strep A Screen: NEGATIVE

## 2019-11-03 NOTE — ED Triage Notes (Signed)
Pt here for sore throat and URI sx x 3 days; pt sts currently [redacted] weeks pregnant; pt sts her duaghters have strep and RSV currently

## 2019-11-03 NOTE — Discharge Instructions (Signed)

## 2019-11-03 NOTE — ED Provider Notes (Signed)
[facility]    CSN: [id number] Arrival date & time: 11/03/19  1315      History   Chief Complaint Chief Complaint  Patient presents with  . Appointment  . Sore Throat  . Nasal Congestion    HPI Tiffany Barron is a 34 y.o. female presenting for 3-day course of sore throat, URI symptoms.  Patient currently [redacted] weeks gestation.  No fever, nausea, vomiting, abdominal pain, chest pain, shortness of breath.  States her daughters both have strep and RSV.  No known Covid contacts.     History reviewed. No pertinent past medical history.  Patient Active Problem List   Diagnosis Date Noted  . Twin gestation in third trimester 02/03/2017    Past Surgical History:  Procedure Laterality Date  . CESAREAN SECTION N/A 02/03/2017   Procedure: CESAREAN SECTION;  Surgeon: Allyn Kenner, DO;  Location: [place];  Service: Obstetrics;  Laterality: N/A;  . VAGINAL DELIVERY N/A 02/03/2017   Procedure: TWIN DOUBLE SET UP FOR VAGINAL DELIVERY;  Surgeon: Allyn Kenner, DO;  Location: [place];  Service: Obstetrics;  Laterality: N/A;  twin double set up fo vag del    OB History    Gravida  2   Para  1   Term      Preterm  1   AB      Living  2     SAB      TAB      Ectopic      Multiple  1   Live Births  2            Home Medications    Prior to Admission medications   Medication Sig Start Date End Date Taking? Authorizing Provider  ciprofloxacin (CIPRO) 500 MG tablet ciprofloxacin 500 mg tablet    [provider]  clobetasol cream (TEMOVATE) 1.09 % Apply 1 application topically 2 (two) times daily. 08/07/17   Kara Dies, NP  oxyCODONE (OXY IR/ROXICODONE) 5 MG immediate release tablet Take 1 tablet (5 mg total) by mouth every 4 (four) hours as needed for moderate pain (for pain score < 7/10). Patient not taking: Reported on 08/07/2017 02/06/17   Olga Millers, MD  Prenatal Vit-Fe Fumarate-FA (PRENATAL  MULTIVITAMIN) TABS tablet Take 1 tablet daily at 12 noon by mouth.    [provider]    Family History History reviewed. No pertinent family history.  Social History Social History   Tobacco Use  . Smoking status: Never Smoker  . Smokeless tobacco: Never Used  Substance Use Topics  . Alcohol use: No  . Drug use: No     Allergies   Patient has no known allergies.   Review of Systems As per HPI   Physical Exam Triage Vital Signs ED Triage Vitals [11/03/19 1340]  Enc Vitals Group     BP 103/60     Pulse Rate (!) 102     Resp 18     Temp 98.2 F (36.8 C)     Temp Source Oral     SpO2 99 %     Weight      Height      Head Circumference      Peak Flow      Pain Score 5     Pain Loc      Pain Edu?      Excl. in [place]?    No data found.  Updated Vital Signs BP 103/60 (BP Location: Left Arm)  Pulse (!) 102   Temp 98.2 F (36.8 C) (Oral)   Resp 18   SpO2 99%   Visual Acuity Right Eye Distance:   Left Eye Distance:   Bilateral Distance:    Right Eye Near:   Left Eye Near:    Bilateral Near:     Physical Exam Constitutional:      General: She is not in acute distress.    Appearance: She is obese. She is not ill-appearing or diaphoretic.  HENT:     Head: Normocephalic and atraumatic.     Mouth/Throat:     Mouth: Mucous membranes are moist.     Pharynx: Oropharynx is clear. No oropharyngeal exudate or posterior oropharyngeal erythema.  Eyes:     General: No scleral icterus.    Conjunctiva/sclera: Conjunctivae normal.     Pupils: Pupils are equal, round, and reactive to light.  Neck:     Comments: Trachea midline, negative JVD Cardiovascular:     Rate and Rhythm: Normal rate and regular rhythm.     Heart sounds: No murmur heard.  No gallop.   Pulmonary:     Effort: Pulmonary effort is normal. No respiratory distress.     Breath sounds: No wheezing, rhonchi or rales.  Musculoskeletal:     Cervical back: Neck supple. No tenderness.    Lymphadenopathy:     Cervical: No cervical adenopathy.  Skin:    Capillary Refill: Capillary refill takes less than 2 seconds.     Coloration: Skin is not jaundiced or pale.     Findings: No rash.  Neurological:     General: No focal deficit present.     Mental Status: She is alert and oriented to person, place, and time.      UC Treatments / Results  Labs (all labs ordered are listed, but only abnormal results are displayed) Labs Reviewed  POCT RAPID STREP A (OFFICE) - Normal  CULTURE, GROUP A STREP [facility])    EKG   Radiology No results found.  Procedures Procedures (including critical care time)  Medications Ordered in UC Medications - No data to display  Initial Impression / Assessment and Plan / UC Course  I have reviewed the triage vital signs and the nursing notes.  Pertinent labs & imaging results that were available during my care of the patient were reviewed by me and considered in my medical decision making (see chart for details).     Patient afebrile, nontoxic, with SpO2 99%.  Covid PCR pending.  Patient to quarantine until results are back.  We will treat supportively as outlined below.  Return precautions discussed, patient verbalized understanding and is agreeable to plan. Final Clinical Impressions(s) / UC Diagnoses   Final diagnoses:  URI with cough and congestion     Discharge Instructions     Your rapid strep test was negative today.  The culture is pending.  Please look on your MyChart for test results.   We will notify you if the culture positive and outline a treatment plan at that time.   Please continue Tylenol and/or Ibuprofen as needed for fever, pain.  May try warm salt water gargles, cepacol lozenges, throat spray, warm tea or water with lemon/honey, or OTC cold relief medicine for throat discomfort.   For congestion: take a daily anti-histamine like Zyrtec, Claritin, and a oral decongestant to help with post nasal drip that may be  irritating your throat.   It is important to stay hydrated: drink plenty of fluids (primarily water)  to keep your throat moisturized and help further relieve irritation/discomfort.     ED Prescriptions    None     PDMP not reviewed this encounter.   [place], [place], [place] 11/03/19 1633

## 2019-11-04 LAB — CULTURE, GROUP A STREP (THRC)

## 2019-11-06 LAB — CULTURE, GROUP A STREP (THRC)

## 2019-11-12 DIAGNOSIS — Z369 Encounter for antenatal screening, unspecified: Secondary | ICD-10-CM | POA: Diagnosis not present

## 2019-11-12 DIAGNOSIS — Z3482 Encounter for supervision of other normal pregnancy, second trimester: Secondary | ICD-10-CM | POA: Diagnosis not present

## 2019-11-12 MED FILL — ONDANSETRON ODT 8 MG TABLET: 8 | 15 days supply | Qty: 30 | Fill #0

## 2019-12-07 DIAGNOSIS — Z363 Encounter for antenatal screening for malformations: Secondary | ICD-10-CM | POA: Diagnosis not present

## 2020-01-06 DIAGNOSIS — Z369 Encounter for antenatal screening, unspecified: Secondary | ICD-10-CM | POA: Diagnosis not present

## 2020-01-20 ENCOUNTER — Other Ambulatory Visit (HOSPITAL_COMMUNITY): Payer: Self-pay | Admitting: Obstetrics and Gynecology

## 2020-01-20 DIAGNOSIS — J029 Acute pharyngitis, unspecified: Secondary | ICD-10-CM | POA: Diagnosis not present

## 2020-01-20 MED FILL — AZITHROMYCIN 250 MG TABLET: 250 | 5 days supply | Qty: 6 | Fill #0

## 2020-02-03 DIAGNOSIS — Z3480 Encounter for supervision of other normal pregnancy, unspecified trimester: Secondary | ICD-10-CM | POA: Diagnosis not present

## 2020-02-03 DIAGNOSIS — Z23 Encounter for immunization: Secondary | ICD-10-CM | POA: Diagnosis not present

## 2020-02-14 DIAGNOSIS — L570 Actinic keratosis: Secondary | ICD-10-CM | POA: Diagnosis not present

## 2020-02-14 DIAGNOSIS — D225 Melanocytic nevi of trunk: Secondary | ICD-10-CM | POA: Diagnosis not present

## 2020-02-14 DIAGNOSIS — D2362 Other benign neoplasm of skin of left upper limb, including shoulder: Secondary | ICD-10-CM | POA: Diagnosis not present

## 2020-02-14 DIAGNOSIS — L814 Other melanin hyperpigmentation: Secondary | ICD-10-CM | POA: Diagnosis not present

## 2020-02-25 DIAGNOSIS — Z369 Encounter for antenatal screening, unspecified: Secondary | ICD-10-CM | POA: Diagnosis not present

## 2020-03-22 DIAGNOSIS — Z369 Encounter for antenatal screening, unspecified: Secondary | ICD-10-CM | POA: Diagnosis not present

## 2020-03-24 ENCOUNTER — Other Ambulatory Visit: Payer: Self-pay | Admitting: Obstetrics and Gynecology

## 2020-04-07 DIAGNOSIS — Z369 Encounter for antenatal screening, unspecified: Secondary | ICD-10-CM | POA: Diagnosis not present

## 2020-04-07 DIAGNOSIS — Z3480 Encounter for supervision of other normal pregnancy, unspecified trimester: Secondary | ICD-10-CM | POA: Diagnosis not present

## 2020-04-07 LAB — OB RESULTS CONSOLE GBS: GBS: NEGATIVE

## 2020-04-14 DIAGNOSIS — Z369 Encounter for antenatal screening, unspecified: Secondary | ICD-10-CM | POA: Diagnosis not present

## 2020-04-19 ENCOUNTER — Encounter (HOSPITAL_COMMUNITY): Payer: Self-pay | Admitting: *Deleted

## 2020-04-19 ENCOUNTER — Telehealth (HOSPITAL_COMMUNITY): Payer: Self-pay | Admitting: *Deleted

## 2020-04-19 NOTE — Telephone Encounter (Signed)
Preadmission screen  

## 2020-04-19 NOTE — Patient Instructions (Signed)
Tiffany Barron  04/19/2020   Your procedure is scheduled on:  04/25/2020  Arrive at 4 at Entrance C on [address] at [facility]  and [address]. You are invited to use the FREE valet parking or use the Visitor's parking deck.  Pick up the phone at the desk and dial [phone].  Call this number if you have problems the morning of surgery: [phone]  Remember:   Do not eat food:(After Midnight) Desps de medianoche.  Do not drink clear liquids: (After Midnight) Desps de medianoche.  Take these medicines the morning of surgery with A SIP OF WATER:  none   Do not wear jewelry, make-up or nail polish.  Do not wear lotions, powders, or perfumes. Do not wear deodorant.  Do not shave 48 hours prior to surgery.  Do not bring valuables to the hospital.  [facility] is not   responsible for any belongings or valuables brought to the hospital.  Contacts, dentures or bridgework may not be worn into surgery.  Leave suitcase in the car. After surgery it may be brought to your room.  For patients admitted to the hospital, checkout time is 11:00 AM the day of              discharge.      Please read over the following fact sheets that you were given:     Preparing for Surgery

## 2020-04-20 ENCOUNTER — Encounter (HOSPITAL_COMMUNITY): Payer: Self-pay

## 2020-04-20 ENCOUNTER — Telehealth (HOSPITAL_COMMUNITY): Payer: Self-pay | Admitting: *Deleted

## 2020-04-20 NOTE — Telephone Encounter (Signed)
Preadmission screen  

## 2020-04-21 DIAGNOSIS — Z369 Encounter for antenatal screening, unspecified: Secondary | ICD-10-CM | POA: Diagnosis not present

## 2020-04-24 ENCOUNTER — Encounter (HOSPITAL_COMMUNITY)
Admission: RE | Admit: 2020-04-24 | Discharge: 2020-04-24 | Disposition: A | Discharge status: Home / Self Care | Payer: 59 | Source: Ambulatory Visit | Attending: Obstetrics and Gynecology | Admitting: Obstetrics and Gynecology

## 2020-04-24 ENCOUNTER — Other Ambulatory Visit: Payer: Self-pay

## 2020-04-24 ENCOUNTER — Other Ambulatory Visit (HOSPITAL_COMMUNITY)
Admission: RE | Admit: 2020-04-24 | Discharge: 2020-04-24 | Disposition: A | Discharge status: Home / Self Care | Payer: 59 | Source: Ambulatory Visit | Attending: Obstetrics and Gynecology | Admitting: Obstetrics and Gynecology

## 2020-04-24 DIAGNOSIS — Z20822 Contact with and (suspected) exposure to covid-19: Secondary | ICD-10-CM | POA: Insufficient documentation

## 2020-04-24 DIAGNOSIS — Z3A39 39 weeks gestation of pregnancy: Secondary | ICD-10-CM | POA: Diagnosis not present

## 2020-04-24 DIAGNOSIS — Z01812 Encounter for preprocedural laboratory examination: Secondary | ICD-10-CM | POA: Insufficient documentation

## 2020-04-24 DIAGNOSIS — O34211 Maternal care for low transverse scar from previous cesarean delivery: Secondary | ICD-10-CM | POA: Diagnosis not present

## 2020-04-24 DIAGNOSIS — Z302 Encounter for sterilization: Secondary | ICD-10-CM | POA: Diagnosis not present

## 2020-04-24 HISTORY — DX: Other specified health status: Z78.9

## 2020-04-24 HISTORY — DX: Malignant hyperthermia due to anesthesia, initial encounter: T88.3XXA

## 2020-04-24 LAB — CBC
HCT: 33.3 % — ABNORMAL LOW (ref 36.0–46.0)
Hemoglobin: 10.8 g/dL — ABNORMAL LOW (ref 12.0–15.0)
MCH: 27.7 pg (ref 26.0–34.0)
MCHC: 32.4 g/dL (ref 30.0–36.0)
MCV: 85.4 fL (ref 80.0–100.0)
Platelets: 230 10*3/uL (ref 150–400)
RBC: 3.9 MIL/uL (ref 3.87–5.11)
RDW: 15.7 % — ABNORMAL HIGH (ref 11.5–15.5)
WBC: 9.6 10*3/uL (ref 4.0–10.5)
nRBC: 0 % (ref 0.0–0.2)

## 2020-04-24 LAB — TYPE AND SCREEN
ABO/RH(D): O POS
Antibody Screen: NEGATIVE

## 2020-04-24 LAB — SARS CORONAVIRUS 2 (TAT 6-24 HRS): SARS Coronavirus 2: NEGATIVE

## 2020-04-24 NOTE — Anesthesia Preprocedure Evaluation (Signed)
Anesthesia Evaluation  Patient identified by MRN, date of birth, ID band Patient awake    Reviewed: Allergy & Precautions, NPO status , Patient's Chart, lab work & pertinent test results  History of Anesthesia Complications (+) MALIGNANT HYPERTHERMIA, Family history of anesthesia reaction and history of anesthetic complications (grandfather died during anesthesia in ~1950s, brother and first cousin with hx of MH (cousin with positive muscle biopsy) )  Airway Mallampati: II  TM Distance: >3 FB Neck ROM: Full    Dental no notable dental hx.    Pulmonary neg pulmonary ROS,    Pulmonary exam normal        Cardiovascular negative cardio ROS Normal cardiovascular exam     Neuro/Psych negative neurological ROS  negative psych ROS   GI/Hepatic negative GI ROS, Neg liver ROS,   Endo/Other  negative endocrine ROS  Renal/GU negative Renal ROS  negative genitourinary   Musculoskeletal negative musculoskeletal ROS (+)   Abdominal   Peds  Hematology negative hematology ROS (+)   Anesthesia Other Findings Day of surgery medications reviewed with patient.  Reproductive/Obstetrics (+) Pregnancy (hx of C/S x1)                            Anesthesia Physical Anesthesia Plan  ASA: II  Anesthesia Plan: Spinal   Post-op Pain Management:    Induction:   PONV Risk Score and Plan: 4 or greater and Treatment may vary due to age or medical condition, Ondansetron and Dexamethasone  Airway Management Planned: Natural Airway  Additional Equipment: None  Intra-op Plan:   Post-operative Plan:   Informed Consent: I have reviewed the patients History and Physical, chart, labs and discussed the procedure including the risks, benefits and alternatives for the proposed anesthesia with the patient or authorized representative who has indicated his/her understanding and acceptance.       Plan Discussed with:  CRNA  Anesthesia Plan Comments: (Room to be set up for MH precautions.)       Anesthesia Quick Evaluation

## 2020-04-25 ENCOUNTER — Inpatient Hospital Stay (HOSPITAL_COMMUNITY)
Admission: RE | Admit: 2020-04-25 | Discharge: 2020-04-27 | DRG: 785 | Disposition: A | Discharge status: Home / Self Care | Payer: 59 | Attending: Obstetrics and Gynecology | Admitting: Obstetrics and Gynecology

## 2020-04-25 ENCOUNTER — Encounter (HOSPITAL_COMMUNITY)
Admission: RE | Disposition: A | Discharge status: Home / Self Care | Payer: Self-pay | Source: Home / Self Care | Attending: Obstetrics and Gynecology

## 2020-04-25 ENCOUNTER — Encounter (HOSPITAL_COMMUNITY): Payer: Self-pay | Admitting: Obstetrics and Gynecology

## 2020-04-25 ENCOUNTER — Inpatient Hospital Stay (HOSPITAL_COMMUNITY): Payer: 59 | Admitting: Anesthesiology

## 2020-04-25 ENCOUNTER — Other Ambulatory Visit: Payer: Self-pay

## 2020-04-25 DIAGNOSIS — Z302 Encounter for sterilization: Secondary | ICD-10-CM

## 2020-04-25 DIAGNOSIS — Z3A39 39 weeks gestation of pregnancy: Secondary | ICD-10-CM | POA: Diagnosis not present

## 2020-04-25 DIAGNOSIS — Z3490 Encounter for supervision of normal pregnancy, unspecified, unspecified trimester: Secondary | ICD-10-CM

## 2020-04-25 DIAGNOSIS — Z20822 Contact with and (suspected) exposure to covid-19: Secondary | ICD-10-CM | POA: Diagnosis present

## 2020-04-25 DIAGNOSIS — O34211 Maternal care for low transverse scar from previous cesarean delivery: Secondary | ICD-10-CM | POA: Diagnosis present

## 2020-04-25 LAB — RPR: RPR Ser Ql: NONREACTIVE

## 2020-04-25 SURGERY — Surgical Case
Anesthesia: Spinal | Laterality: Bilateral

## 2020-04-25 MED ORDER — PROMETHAZINE HCL 25 MG/ML IJ SOLN: 6.2500 mg | INTRAMUSCULAR | Status: DC | PRN

## 2020-04-25 MED ORDER — KETOROLAC TROMETHAMINE 30 MG/ML IJ SOLN
INTRAMUSCULAR | Status: AC
  Filled 2020-04-25: qty 1

## 2020-04-25 MED ORDER — HYDROMORPHONE HCL 1 MG/ML IJ SOLN
0.2000 mg | INTRAMUSCULAR | Status: DC | PRN
Start: 2020-04-25 — End: 2020-04-27

## 2020-04-25 MED ORDER — DIBUCAINE (PERIANAL) 1 % EX OINT: 1.0000 "application " | TOPICAL_OINTMENT | CUTANEOUS | Status: DC | PRN

## 2020-04-25 MED ORDER — NALBUPHINE HCL 10 MG/ML IJ SOLN: 5.0000 mg | Freq: Once | INTRAMUSCULAR | Status: DC | PRN

## 2020-04-25 MED ORDER — POVIDONE-IODINE 10 % EX SWAB
2.0000 "application " | Freq: Once | CUTANEOUS | Status: AC
  Administered 2020-04-25: 2 via TOPICAL

## 2020-04-25 MED ORDER — PHENYLEPHRINE HCL-NACL 20-0.9 MG/250ML-% IV SOLN
INTRAVENOUS | Status: DC | PRN
  Administered 2020-04-25: 60 ug/min via INTRAVENOUS

## 2020-04-25 MED ORDER — TETANUS-DIPHTH-ACELL PERTUSSIS 5-2.5-18.5 LF-MCG/0.5 IM SUSY: 0.5000 mL | PREFILLED_SYRINGE | Freq: Once | INTRAMUSCULAR | Status: DC

## 2020-04-25 MED ORDER — MORPHINE SULFATE (PF) 0.5 MG/ML IJ SOLN
INTRAMUSCULAR | Status: AC
  Filled 2020-04-25: qty 10

## 2020-04-25 MED ORDER — FENTANYL CITRATE (PF) 100 MCG/2ML IJ SOLN
INTRAMUSCULAR | Status: AC
  Filled 2020-04-25: qty 2

## 2020-04-25 MED ORDER — ACETAMINOPHEN 160 MG/5ML PO SOLN: 1000.0000 mg | Freq: Once | ORAL | Status: DC

## 2020-04-25 MED ORDER — SENNOSIDES-DOCUSATE SODIUM 8.6-50 MG PO TABS
2.0000 | ORAL_TABLET | Freq: Every day | ORAL | Status: DC
  Administered 2020-04-26 – 2020-04-27 (×2): 2 via ORAL
  Filled 2020-04-25 (×2): qty 2

## 2020-04-25 MED ORDER — SOD CITRATE-CITRIC ACID 500-334 MG/5ML PO SOLN
30.0000 mL | ORAL | Status: AC
  Administered 2020-04-25: 30 mL via ORAL

## 2020-04-25 MED ORDER — MORPHINE SULFATE (PF) 0.5 MG/ML IJ SOLN
INTRAMUSCULAR | Status: DC | PRN
  Administered 2020-04-25: 150 ug via INTRATHECAL

## 2020-04-25 MED ORDER — ACETAMINOPHEN 500 MG PO TABS: 1000.0000 mg | ORAL_TABLET | Freq: Once | ORAL | Status: DC

## 2020-04-25 MED ORDER — BUPIVACAINE IN DEXTROSE 0.75-8.25 % IT SOLN
INTRATHECAL | Status: DC | PRN
  Administered 2020-04-25: 1.6 mL via INTRATHECAL

## 2020-04-25 MED ORDER — METHYLERGONOVINE MALEATE 0.2 MG PO TABS: 0.2000 mg | ORAL_TABLET | ORAL | Status: DC | PRN

## 2020-04-25 MED ORDER — NALBUPHINE HCL 10 MG/ML IJ SOLN: 5.0000 mg | INTRAMUSCULAR | Status: DC | PRN

## 2020-04-25 MED ORDER — SODIUM CHLORIDE 0.9% FLUSH: 3.0000 mL | INTRAVENOUS | Status: DC | PRN

## 2020-04-25 MED ORDER — OXYTOCIN-SODIUM CHLORIDE 30-0.9 UT/500ML-% IV SOLN
INTRAVENOUS | Status: DC | PRN
  Administered 2020-04-25: 30 [IU] via INTRAVENOUS

## 2020-04-25 MED ORDER — NALOXONE HCL 0.4 MG/ML IJ SOLN
0.4000 mg | INTRAMUSCULAR | Status: DC | PRN
Start: 2020-04-25 — End: 2020-04-27

## 2020-04-25 MED ORDER — KETOROLAC TROMETHAMINE 30 MG/ML IJ SOLN
30.0000 mg | Freq: Once | INTRAMUSCULAR | Status: AC
  Administered 2020-04-25: 30 mg via INTRAVENOUS

## 2020-04-25 MED ORDER — ONDANSETRON HCL 4 MG/2ML IJ SOLN
INTRAMUSCULAR | Status: DC | PRN
  Administered 2020-04-25: 4 mg via INTRAVENOUS

## 2020-04-25 MED ORDER — DIPHENHYDRAMINE HCL 25 MG PO CAPS: 25.0000 mg | ORAL_CAPSULE | Freq: Four times a day (QID) | ORAL | Status: DC | PRN

## 2020-04-25 MED ORDER — LACTATED RINGERS IV SOLN: INTRAVENOUS | Status: DC

## 2020-04-25 MED ORDER — ACETAMINOPHEN 500 MG PO TABS
1000.0000 mg | ORAL_TABLET | Freq: Four times a day (QID) | ORAL | Status: DC
  Administered 2020-04-25 – 2020-04-27 (×8): 1000 mg via ORAL
  Filled 2020-04-25 (×8): qty 2

## 2020-04-25 MED ORDER — CEFAZOLIN SODIUM-DEXTROSE 2-4 GM/100ML-% IV SOLN
INTRAVENOUS | Status: AC
  Filled 2020-04-25: qty 100

## 2020-04-25 MED ORDER — SIMETHICONE 80 MG PO CHEW
80.0000 mg | CHEWABLE_TABLET | Freq: Three times a day (TID) | ORAL | Status: DC
  Administered 2020-04-26 – 2020-04-27 (×3): 80 mg via ORAL
  Filled 2020-04-25 (×3): qty 1

## 2020-04-25 MED ORDER — MENTHOL 3 MG MT LOZG: 1.0000 | LOZENGE | OROMUCOSAL | Status: DC | PRN

## 2020-04-25 MED ORDER — WITCH HAZEL-GLYCERIN EX PADS
1.0000 | MEDICATED_PAD | CUTANEOUS | Status: DC | PRN
Start: 2020-04-25 — End: 2020-04-27

## 2020-04-25 MED ORDER — OXYTOCIN-SODIUM CHLORIDE 30-0.9 UT/500ML-% IV SOLN: 2.5000 [IU]/h | INTRAVENOUS | Status: AC

## 2020-04-25 MED ORDER — NALOXONE HCL 4 MG/10ML IJ SOLN
1.0000 ug/kg/h | INTRAVENOUS | Status: DC | PRN
  Filled 2020-04-25: qty 5

## 2020-04-25 MED ORDER — CEFAZOLIN SODIUM-DEXTROSE 2-4 GM/100ML-% IV SOLN
2.0000 g | INTRAVENOUS | Status: AC
  Administered 2020-04-25: 2 g via INTRAVENOUS

## 2020-04-25 MED ORDER — KETOROLAC TROMETHAMINE 30 MG/ML IJ SOLN: 30.0000 mg | Freq: Four times a day (QID) | INTRAMUSCULAR | Status: DC | PRN

## 2020-04-25 MED ORDER — ZOLPIDEM TARTRATE 5 MG PO TABS: 5.0000 mg | ORAL_TABLET | Freq: Every evening | ORAL | Status: DC | PRN

## 2020-04-25 MED ORDER — METHYLERGONOVINE MALEATE 0.2 MG/ML IJ SOLN: 0.2000 mg | INTRAMUSCULAR | Status: DC | PRN

## 2020-04-25 MED ORDER — PRENATAL MULTIVITAMIN CH
1.0000 | ORAL_TABLET | Freq: Every day | ORAL | Status: DC
  Administered 2020-04-26 – 2020-04-27 (×2): 1 via ORAL
  Filled 2020-04-25 (×2): qty 1

## 2020-04-25 MED ORDER — SOD CITRATE-CITRIC ACID 500-334 MG/5ML PO SOLN
ORAL | Status: AC
  Filled 2020-04-25: qty 30

## 2020-04-25 MED ORDER — FENTANYL CITRATE (PF) 100 MCG/2ML IJ SOLN
INTRAMUSCULAR | Status: DC | PRN
  Administered 2020-04-25: 15 ug via INTRATHECAL

## 2020-04-25 MED ORDER — OXYCODONE HCL 5 MG PO TABS
5.0000 mg | ORAL_TABLET | ORAL | Status: DC | PRN
  Administered 2020-04-26 – 2020-04-27 (×3): 5 mg via ORAL
  Filled 2020-04-25 (×3): qty 1

## 2020-04-25 MED ORDER — FENTANYL CITRATE (PF) 100 MCG/2ML IJ SOLN: 25.0000 ug | INTRAMUSCULAR | Status: DC | PRN

## 2020-04-25 MED ORDER — ACETAMINOPHEN 500 MG PO TABS: 1000.0000 mg | ORAL_TABLET | Freq: Four times a day (QID) | ORAL | Status: DC

## 2020-04-25 MED ORDER — DEXAMETHASONE SODIUM PHOSPHATE 4 MG/ML IJ SOLN
INTRAMUSCULAR | Status: DC | PRN
  Administered 2020-04-25: 10 mg via INTRAVENOUS

## 2020-04-25 MED ORDER — DIPHENHYDRAMINE HCL 50 MG/ML IJ SOLN: 12.5000 mg | Freq: Four times a day (QID) | INTRAMUSCULAR | Status: DC | PRN

## 2020-04-25 MED ORDER — SIMETHICONE 80 MG PO CHEW: 80.0000 mg | CHEWABLE_TABLET | ORAL | Status: DC | PRN

## 2020-04-25 MED ORDER — COCONUT OIL OIL: 1.0000 "application " | TOPICAL_OIL | Status: DC | PRN

## 2020-04-25 MED ORDER — ONDANSETRON HCL 4 MG/2ML IJ SOLN: 4.0000 mg | Freq: Three times a day (TID) | INTRAMUSCULAR | Status: DC | PRN

## 2020-04-25 SURGICAL SUPPLY — 33 items
CHLORAPREP W/TINT 26ML (MISCELLANEOUS) ×2 IMPLANT
CLAMP CORD UMBIL (MISCELLANEOUS) IMPLANT
CLOTH BEACON ORANGE TIMEOUT ST (SAFETY) ×2 IMPLANT
DERMABOND ADVANCED (GAUZE/BANDAGES/DRESSINGS) ×2
DERMABOND ADVANCED .7 DNX12 (GAUZE/BANDAGES/DRESSINGS) ×2 IMPLANT
DRSG OPSITE POSTOP 4X10 (GAUZE/BANDAGES/DRESSINGS) ×2 IMPLANT
ELECT REM PT RETURN 9FT ADLT (ELECTROSURGICAL) ×2
ELECTRODE REM PT RTRN 9FT ADLT (ELECTROSURGICAL) ×1 IMPLANT
EXTRACTOR VACUUM M CUP 4 TUBE (SUCTIONS) IMPLANT
GLOVE BIO SURGEON STRL SZ7 (GLOVE) ×2 IMPLANT
GLOVE BIOGEL PI IND STRL 7.0 (GLOVE) ×2 IMPLANT
GLOVE BIOGEL PI INDICATOR 7.0 (GLOVE) ×2
GOWN STRL REUS W/TWL LRG LVL3 (GOWN DISPOSABLE) ×4 IMPLANT
KIT ABG SYR 3ML LUER SLIP (SYRINGE) IMPLANT
NEEDLE HYPO 22GX1.5 SAFETY (NEEDLE) IMPLANT
NEEDLE HYPO 25X5/8 SAFETYGLIDE (NEEDLE) IMPLANT
NS IRRIG 1000ML POUR BTL (IV SOLUTION) ×2 IMPLANT
PACK C SECTION WH (CUSTOM PROCEDURE TRAY) ×2 IMPLANT
PAD OB MATERNITY 4.3X12.25 (PERSONAL CARE ITEMS) ×2 IMPLANT
PENCIL SMOKE EVAC W/HOLSTER (ELECTROSURGICAL) ×2 IMPLANT
RTRCTR C-SECT PINK 25CM LRG (MISCELLANEOUS) ×2 IMPLANT
SUT CHROMIC 1 CTX 36 (SUTURE) ×4 IMPLANT
SUT CHROMIC 2 0 CT 1 (SUTURE) ×2 IMPLANT
SUT PDS AB 0 CTX 60 (SUTURE) ×2 IMPLANT
SUT PLAIN 0 NONE (SUTURE) ×2 IMPLANT
SUT PLAIN 2 0 XLH (SUTURE) ×2 IMPLANT
SUT VIC AB 2-0 CT1 27 (SUTURE) ×1
SUT VIC AB 2-0 CT1 TAPERPNT 27 (SUTURE) ×1 IMPLANT
SUT VIC AB 4-0 KS 27 (SUTURE) IMPLANT
SYR 30ML LL (SYRINGE) IMPLANT
TOWEL OR 17X24 6PK STRL BLUE (TOWEL DISPOSABLE) ×2 IMPLANT
TRAY FOLEY W/BAG SLVR 14FR LF (SET/KITS/TRAYS/PACK) ×2 IMPLANT
WATER STERILE IRR 1000ML POUR (IV SOLUTION) ×2 IMPLANT

## 2020-04-25 NOTE — H&P (Signed)
Jamayia Croker is a 35 y.o. female presenting for repeat section and tubal OB History    Gravida  2   Para  1   Term      Preterm  1   AB      Living  2     SAB      IAB      Ectopic      Multiple  1   Live Births  2          Past Medical History:  Diagnosis Date  . Malignant hyperthermia    brother and first cousin  . Medical history non-contributory    Past Surgical History:  Procedure Laterality Date  . CESAREAN SECTION N/A 02/03/2017   Procedure: CESAREAN SECTION;  Surgeon: Allyn Kenner, DO;  Location: [place];  Service: Obstetrics;  Laterality: N/A;  . VAGINAL DELIVERY N/A 02/03/2017   Procedure: TWIN DOUBLE SET UP FOR VAGINAL DELIVERY;  Surgeon: Allyn Kenner, DO;  Location: [place];  Service: Obstetrics;  Laterality: N/A;  twin double set up fo vag del   Family History: family history includes Malignant hyperthermia in her brother and cousin. Social History:  reports that she has never smoked. She has never used smokeless tobacco. She reports that she does not drink alcohol and does not use drugs.     Maternal Diabetes: No Genetic Screening: Normal Maternal Ultrasounds/Referrals: Normal Fetal Ultrasounds or other Referrals:  None Maternal Substance Abuse:  No Significant Maternal Medications:  None Significant Maternal Lab Results:  None Other Comments:  None  Review of Systems History   Blood pressure 118/77, temperature 98.7 F (37.1 C), temperature source Oral, resp. rate 18, height 5\' 1"  (1.549 m), weight 85.3 kg, SpO2 100 %, currently breastfeeding. Exam Physical Exam  Prenatal labs: ABO, Rh: --/--/O POS (02/07 1007) Antibody: NEG (02/07 1007) Rubella: Immune (07/29 0000) RPR: NON REACTIVE (02/07 1020)  HBsAg: Negative (07/29 0000)  HIV: Non-reactive (07/29 0000)  GBS: Negative/-- (01/21 0000)   Assessment/Plan: 1) admit 2) proceed with c/s &btl   Vanessa Kick 04/25/2020, 12:09 PM

## 2020-04-25 NOTE — Lactation Note (Signed)
This note was copied from a baby's chart. Lactation Consultation Note  Patient Name: Tiffany Barron [id number] Date: 04/25/2020 Reason for consult: Initial assessment;Term Age:35 hours P3, term female infant. Per mom, infant breastfeed for 10 minutes in recovery been sleepy. Per mom, earlier infant turned blue reminded her when this happen with one of her twins, mom plans to use bulb syringe and burp baby. LC gave mom hand pump to pre-pump breast prior to latching infant, mom left breast is pseudo-inverted and both breast are short shafted.  Mom did breast stimulation to evert nipples shaft out prior to latching infant at the breast, infant latched on mom's left breast using the football hold position, after few attempts infant latched with depth and breastfeed for 5 minutes. Mom taught hand expression and infant was given 5 mls of colostrum by spoon. Mom will continue to work with latching infant at the breast and knows to ask RN or [facility] for assistance with latch if needed. LC assisted mom in how to use hand pump to help evert nipple shaft out more and mom easily expressed 5 mls in hand pump, she offer to infant at next feeding by spoon ,after she latches infant at the breast first.  Infant was cuing again, dad was doing STS with infant, mom re-latched infant on her right breast , without assistance from [facility], infant was still BF when [place] left the room. Mom was pleased that infant is breastfeeding, she was unable to latch her twins at birth. Mom knows to BF infant according to cues, 8 to 12+ times, STS. Mom made aware of O/P services, breastfeeding support groups, community resources, and our phone # for post-discharge questions.  Maternal Data Does the patient have breastfeeding experience prior to this delivery?: Yes How long did the patient breastfeed?: Per mom, her twins who are 61 years old did not latch, they were born at 11 weeks in NICU, she pumped only for 15 months.  Feeding Mother's Current  Feeding Choice: Breast Milk  LATCH Score Latch: Grasps breast easily, tongue down, lips flanged, rhythmical sucking.  Audible Swallowing: Spontaneous and intermittent  Type of Nipple: Inverted (Left breast only is psuedo-inverted.)  Comfort (Breast/Nipple): Soft / non-tender  Hold (Positioning): Assistance needed to correctly position infant at breast and maintain latch.  LATCH Score: 7   Lactation Tools Discussed/Used Tools: Pump Breast pump type: Manual Pump Education: Setup, frequency, and cleaning;Milk Storage Reason for Pumping: Mom has short shafted nipples, her left breast is psuedo inverted, but responds well to stimulation. Pumping frequency: When demostration how to use hand pump, mom easily expressed 5 mls of colostrum, she using pump evert nipple shaft out more, prior to latching infant at the breast. Pumped volume: 5 mL  Interventions Interventions: Assisted with latch;Breast feeding basics reviewed;Skin to skin;Hand express;Pre-pump if needed;Breast compression;Adjust position;Support pillows;Position options;Expressed milk;Hand pump  Discharge Pump: Personal;Manual WIC Program: No  Consult Status Consult Status: Follow-up Date: 04/26/20 Follow-up type: In-patient    Vicente Serene 04/25/2020, 5:26 PM

## 2020-04-25 NOTE — Anesthesia Procedure Notes (Signed)
Spinal  Patient location during procedure: OR Start time: 04/25/2020 12:22 PM End time: 04/25/2020 12:25 PM Staffing Performed: anesthesiologist  Anesthesiologist: Brennan Bailey, MD Preanesthetic Checklist Completed: patient identified, IV checked, risks and benefits discussed, monitors and equipment checked, pre-op evaluation and timeout performed Spinal Block Patient position: sitting Prep: DuraPrep and site prepped and draped Patient monitoring: heart rate, continuous pulse ox and blood pressure Approach: midline Location: L3-4 Injection technique: single-shot Needle Needle type: Pencan  Needle gauge: 24 G Needle length: 10 cm Assessment Sensory level: T4 Additional Notes Risks, benefits, and alternative discussed. Patient gave consent to procedure. Prepped and draped in sitting position. Clear CSF obtained after one needle pass. Positive terminal aspiration. No pain or paraesthesias with injection. Patient tolerated procedure well. Vital signs stable. Tawny Asal, MD

## 2020-04-25 NOTE — Transfer of Care (Signed)
Immediate Anesthesia Transfer of Care Note  Patient: Tiffany Barron  Procedure(s) Performed: REPEAT CESAREAN SECTION WITH BILATERAL SALPINGECTOMY (Bilateral )  Patient Location: PACU  Anesthesia Type:Spinal  Level of Consciousness: awake and alert   Airway & Oxygen Therapy: Patient Spontanous Breathing  Post-op Assessment: Report given to RN and Post -op Vital signs reviewed and stable  Post vital signs: Reviewed and stable  Last Vitals:  Vitals Value Taken Time  BP 104/76 04/25/20 1347  Temp    Pulse 69 04/25/20 1349  Resp 18 04/25/20 1349  SpO2 100 % 04/25/20 1349  Vitals shown include unvalidated device data.  Last Pain:  Vitals:   04/25/20 1022  TempSrc: Oral         Complications: No complications documented.

## 2020-04-25 NOTE — Anesthesia Postprocedure Evaluation (Signed)
Anesthesia Post Note  Patient: Tiffany Barron  Procedure(s) Performed: REPEAT CESAREAN SECTION WITH BILATERAL SALPINGECTOMY (Bilateral )     Patient location during evaluation: PACU Anesthesia Type: Spinal Level of consciousness: awake and alert and oriented Pain management: pain level controlled Vital Signs Assessment: post-procedure vital signs reviewed and stable Respiratory status: spontaneous breathing, nonlabored ventilation and respiratory function stable Cardiovascular status: blood pressure returned to baseline Postop Assessment: no apparent nausea or vomiting, spinal receding, no headache and no backache Anesthetic complications: no   No complications documented.  Last Vitals:  Vitals:   04/25/20 1445 04/25/20 1507  BP:  (!) 106/58  Pulse:  67  Resp: 14 18  Temp:  36.4 C  SpO2:  100%    Last Pain:  Vitals:   04/25/20 1507  TempSrc: Oral  PainSc:    Pain Goal:                   Brennan Bailey

## 2020-04-25 NOTE — Op Note (Addendum)
Pre-Operative Diagnosis: 1) 39+0-week intrauterine pregnancy 2) history of prior cesarean section, desires repeat 3) desired permanent sterilization 4) BRCA2 carrier Postoperative Diagnosis: 1) 39+0-week intrauterine pregnancy 2) history of prior cesarean section, desires repeat 3) desired permanent sterilization  4) BRCA2 carrier  Procedure: Repeat low transverse cesarean section and bilateral salpingectomy Surgeon: Dr. Vanessa Kick Assistant: Dr. Maurice Small Operative Findings: Vigorous female infant in the vertex presentation with Apgar scores of 9 at 1 minute and 9 at 5 minutes. Normal-appearing tubes and ovaries. Thin lower uterine segment. Specimen: Bilateral fallopian tubes EBL: Total I/O In: 300 [I.V.:300] Out: 712 [Blood:712]   Procedure:Ms. Millon is an 35 year old gravida 2 para 0102 at 41 weeks and 4 days estimated gestational age who presents for cesarean section. Following the appropriate informed consent the patient was brought to the operating room where spinal anesthesia was administered and found to be adequate. The patient was appropriately identified during a preoperative timeout procedure. She was placed in the dorsal supine position with a leftward tilt. She was prepped and draped in the normal sterile fashion. Scalpel was then used to make a Pfannenstiel skin incision which was carried down to the underlying layers of soft tissue to the fascia. The fascia was incised in the midline and the fascial incision was extended laterally with Mayo scissors. The superior aspect of the fascial incision was grasped with Coker clamps x2, tented up and the rectus muscles dissected off sharply with the electrocautery unit area and the same procedure was repeated on the inferior aspect of the fascial incision. The rectus muscles were separated in the midline. The abdominal peritoneum was identified, tented up, entered sharply, and the incision was extended superiorly and inferiorly with good  visualization of the bladder. The Alexis retractor was then deployed. The vesicouterine peritoneum was identified, tented up, entered sharply, and the bladder flap was created digitally. Scalpel was then used to make a low transverse incision on the uterus which was extended laterally with blunt dissection. The fetal vertex was identified, delivered easily through the uterine incision followed by the body. The infant was bulb suctioned on the operative field cried vigorously. After a 1 minute delay in cord clamping the infant was passed to the waiting neonatology team.  Placenta was then delivered spontaneously, the uterus was cleared of all clot and debris. The uterine incision was repaired with #1 chromic in running locked fashion . Ovaries and tubes were inspected and normal. Attention was turned to the left fallopian tube. The tube was grasped and elevated with babcock clamps and clear spaces were identified in the mesosalpinx. Starting at the fimbriated end, the clear space in the mesosalpinx was identified. The vessels were then tied off with a free tie of 2-0 plain gut suture and then ligated with the electrocautary unit. THis was repeated until the mesosalpinx was incised to the level of the uterine origin of the tube. The fallopain tube was doubly tied and incised. The same procedure was repeated on the right fallopian tube. The Alexis retractor was removed. The uterus was returned to the abdominal cavity the abdominal cavity was cleared of all clot and debris. The abdominal peritoneum was reapproximated with 2-0 Vicryl in a running fashion, the rectus muscles was reapproximated with #1 chromic in a running fashion. The fascia was closed with a looped PDS in a running fashion. The skin was closed with 4-0 vicryl in a subcuticular fashion and Dermabond. All sponge lap and needle counts were correct. Patient tolerated the procedure well and  recovered in stable condition following the procedure.

## 2020-04-26 ENCOUNTER — Encounter (HOSPITAL_COMMUNITY): Payer: Self-pay | Admitting: Obstetrics and Gynecology

## 2020-04-26 LAB — CBC
HCT: 24.9 % — ABNORMAL LOW (ref 36.0–46.0)
Hemoglobin: 7.9 g/dL — ABNORMAL LOW (ref 12.0–15.0)
MCH: 27.6 pg (ref 26.0–34.0)
MCHC: 31.7 g/dL (ref 30.0–36.0)
MCV: 87.1 fL (ref 80.0–100.0)
Platelets: 194 10*3/uL (ref 150–400)
RBC: 2.86 MIL/uL — ABNORMAL LOW (ref 3.87–5.11)
RDW: 15.7 % — ABNORMAL HIGH (ref 11.5–15.5)
WBC: 17.1 10*3/uL — ABNORMAL HIGH (ref 4.0–10.5)
nRBC: 0 % (ref 0.0–0.2)

## 2020-04-26 LAB — BIRTH TISSUE RECOVERY COLLECTION (PLACENTA DONATION)

## 2020-04-26 NOTE — Progress Notes (Addendum)
Subjective: Postpartum Day 1: Cesarean Delivery Tiffany Barron is doing well this morning. Reports good pain control. Ambulating, voiding, tolerating PO. No chest pain, shortness of breath, calf pain. Having some trouble with breastfeeding, looking forward to working with [facility] today.   Objective: Patient Vitals for the past 24 hrs:  BP Temp Temp src Pulse Resp SpO2 Height Weight  04/26/20 0525 (!) 99/58 98.2 F (36.8 C) Oral 75 17 98 % - -  04/26/20 0100 (!) 93/56 98.4 F (36.9 C) Oral 70 18 97 % - -  04/25/20 2035 110/69 97.8 F (36.6 C) Oral 83 18 97 % - -  04/25/20 1625 102/65 98 F (36.7 C) Oral (!) 55 18 100 % - -  04/25/20 1507 (!) 106/58 97.6 F (36.4 C) Oral 67 18 100 % - -  04/25/20 1445 - - - - 14 - - -  04/25/20 1430 (!) 104/59 - - 63 14 100 % - -  04/25/20 1415 106/83 - - 62 16 100 % - -  04/25/20 1400 (!) 100/59 - - 70 15 100 % - -  04/25/20 1347 104/76 98 F (36.7 C) - 70 - 98 % - -  04/25/20 1022 118/77 98.7 F (37.1 C) Oral - 18 100 % 5\' 1"  (1.549 m) 85.3 kg    Physical Exam:  General: alert, cooperative and no distress Lochia: appropriate Uterine Fundus: firm Incision: healing well, no significant drainage, no significant erythema DVT Evaluation: No evidence of DVT seen on physical exam.  Recent Labs    04/24/20 1020  HGB 10.8*  HCT 33.3*    Assessment/Plan: [place] [id number] POD#1 sp repeat cesarean and bilateral salpingectomy at [redacted]w[redacted]d 1. PPC: continue routine postpartum/postop care 2. Rh pos, rubella immune, s/p tdap/flu/covid vaccines. 3. Desires neonatal circumcision, R/B/A of procedure discussed at length. Pt understands that neonatal circumcision is not considered medically necessary and is elective. The risks include, but are not limited to bleeding, infection, damage to the penis, development of scar tissue, and having to have it redone at a later date. Pt understands theses risks and wishes to proceed.  4. Dispo: likely d/c home [place] 04/26/2020, 8:17 AM   ADDENDUM: Hypospadias noted on infant physical exam, referred to urology. Circumcision deferred.  Luther Redo, MD 04/26/20 10:55 AM

## 2020-04-26 NOTE — Lactation Note (Signed)
This note was copied from a baby's chart. Lactation Consultation Note  Patient Name: Tiffany Barron [id number] Date: 04/26/2020 Reason for consult: Follow-up assessment;Term;Infant weight loss;Other (Comment);Difficult latch (4 % weight loss, 2nd LC visit this am for DL) Age:35 hours  Mom called out with feeding cues and asked for [facility] assist.  Mom holding baby and LC offered to assist to latch and showed dad how he  Could help to mom to obtain the depth.  Latched after several attempts successful and baby fed for 16 mins with swallows, increased with breast compressions. See below for latch of 8.  Baby still rooting and latched for few seconds , on and off , attempt.  LC assisted mom to put on the shells to elongate the nipple / areola complex.  Areola edema greater on the left than the right. Nipples both noted to be pink, no breakdown.  LC did not use the NS to latch.  LC plan:  Breast shells while awake between feedings ,  Prior to latch - breast massage, hand express, pre - pump with hand pump, Reverse pressure ,  Firm support to latch and work with baby to open wide and latch with breast compressions until swallows.  Call if needed for assistance.  Maternal Data Has patient been taught Hand Expression?: Yes  Feeding Mother's Current Feeding Choice: Breast Milk  LATCH Score Latch: Repeated attempts needed to sustain latch, nipple held in mouth throughout feeding, stimulation needed to elicit sucking reflex.  Audible Swallowing: Spontaneous and intermittent  Type of Nipple: Everted at rest and after stimulation  Comfort (Breast/Nipple): Soft / non-tender  Hold (Positioning): Assistance needed to correctly position infant at breast and maintain latch.  LATCH Score: 8   Lactation Tools Discussed/Used Tools: Shells;Pump;Flanges (hand pump given to mom prior to [facility] visit) Flange Size: 24 Breast pump type: Manual Pump Education: Milk Storage Pumping frequency:  (LC recommended  to continue to pre- pump after breast massage, hand express, pre - pump to enhance flow and stretch the nipple / areola complex)  Interventions Interventions: Breast feeding basics reviewed;Assisted with latch;Skin to skin;Breast massage;Hand express;Pre-pump if needed;Reverse pressure;Breast compression;Adjust position;Support pillows;Position options;Expressed milk;Shells;Hand pump  Discharge Pump: Manual  Consult Status Consult Status: Follow-up Date: 04/27/20 Follow-up type: In-patient    [place] 04/26/2020, 12:47 PM

## 2020-04-27 ENCOUNTER — Other Ambulatory Visit (HOSPITAL_COMMUNITY): Payer: Self-pay | Admitting: Obstetrics

## 2020-04-27 LAB — SURGICAL PATHOLOGY

## 2020-04-27 MED ORDER — IBUPROFEN 800 MG PO TABS: 800.0000 mg | ORAL_TABLET | Freq: Four times a day (QID) | ORAL | 0 refills | Status: DC | PRN

## 2020-04-27 MED ORDER — OXYCODONE HCL 5 MG PO TABS
5.0000 mg | ORAL_TABLET | ORAL | 0 refills | Status: DC | PRN
Start: 2020-04-27 — End: 2020-04-27

## 2020-04-27 MED FILL — oxyCODONE HCL 5 MG TABS: 5 | 3 days supply | Qty: 20 | Fill #0

## 2020-04-27 MED FILL — IBUPROFEN 800 MG TAB: 800 | 15 days supply | Qty: 60 | Fill #0

## 2020-04-27 NOTE — Lactation Note (Signed)
This note was copied from a baby's chart. Lactation Consultation Note  Patient Name: Tiffany Barron Date: 04/27/2020 Reason for consult: Follow-up assessment Age:35 hours  Baby awake and hungry .  After pedis exam, mom 1st pre pumped and LC reminded mom to do the Reverse pressure due to areola edema.  Mom denies soreness. Nipples pink - and LC reviewed care for prevention.  Baby latched well in the cross cradle on the left breast with depth.  Swallows noted prior to added the 84F SNS with EBM. Baby fed approx 20  Plus  mins and opened wider than yesterday. Per mom comfortable. Latch 8.    Maternal Data Has patient been taught Hand Expression?: Yes  Feeding Mother's Current Feeding Choice: Breast Milk  LATCH Score - see on the doc flow sheets for 0905 feeding,                     Lactation Tools Discussed/Used Tools: Shells;Pump;Flanges;Comfort gels Flange Size: 24;27 Breast pump type: Manual Pump Education: Setup, frequency, and cleaning;Milk Storage Reason for Pumping: PRN , - pre - pump if needed  Interventions    Discharge Discharge Education: Engorgement and breast care;Warning signs for feeding baby Pump: Personal;Manual WIC Program: No  Consult Status Complete - 04/27/2020       Jerlyn Ly Dushaun Okey 04/27/2020, 8:58 AM

## 2020-04-27 NOTE — Progress Notes (Signed)
Patient is doing well.  She is tolerating PO, ambulating, voiding.  Pain is controlled.  Lochia is appropriate  Vitals:   04/26/20 0525 04/26/20 1509 04/26/20 2148 04/27/20 0525  BP: (!) 99/58 (!) 118/51 106/67 115/72  Pulse: 75 87 87 97  Resp: 17 17 16 17   Temp: 98.2 F (36.8 C) 98.5 F (36.9 C) 98.3 F (36.8 C) 97.6 F (36.4 C)  TempSrc: Oral Oral Oral Oral  SpO2: 98% 98% 98% 97%  Weight:      Height:        NAD Abdomen:  soft, appropriate tenderness, incisions intact and without erythema or drainage ext:    Symmetric, no edema bilaterally  Lab Results  Component Value Date   WBC 17.1 (H) 04/26/2020   HGB 7.9 (L) 04/26/2020   HCT 24.9 (L) 04/26/2020   MCV 87.1 04/26/2020   PLT 194 04/26/2020    --/--/O POS (02/07 1007)/RImmune  A/P    36 y.o. G2P1103 POD 2 s/p RCS Routine post op and postpartum care.   Meeting all goals.  Discharge to home today.

## 2020-04-27 NOTE — Discharge Instructions (Signed)
Please take ibuprofen 800mg  and acetaminophen 1000mg  every 6 hours.  If your pain is not controlled, you may add one tablet (5mg ) of oxycodone every 6 hours as needed.  Take miralax daily to prevent constipation

## 2020-04-27 NOTE — Discharge Summary (Signed)
Postpartum Discharge Summary  Date of Service updated 04/27/20     Patient Name: Tiffany Barron DOB: Jul 16, 1985 MRN: [id number]  Date of admission: 04/25/2020 Delivery date:04/25/2020  Delivering provider: Vanessa Kick  Date of discharge: 04/27/2020  Admitting diagnosis: Term pregnancy [Z34.90] Cesarean delivery, delivered, current hospitalization [O82] Intrauterine pregnancy: [redacted]w[redacted]d     Secondary diagnosis:  Active Problems:   Term pregnancy   Cesarean delivery, delivered, current hospitalization  Additional problems: None    Discharge diagnosis: Term Pregnancy Delivered                                              Post partum procedures:None Augmentation: N/A Complications: None  Hospital course: Sceduled C/S   35 y.o. yo G2P1103 at [redacted]w[redacted]d was admitted to the hospital 04/25/2020 for scheduled cesarean section with the following indication:Elective Repeat.Delivery details are as follows:  Membrane Rupture Time/Date: 12:51 PM ,04/25/2020   Delivery Method:C-Section, Low Transverse  Details of operation can be found in separate operative note.  Patient had an uncomplicated postpartum course.  She is ambulating, tolerating a regular diet, passing flatus, and urinating well. Patient is discharged home in stable condition on  04/27/20        Newborn Data: Birth date:04/25/2020  Birth time:12:51 PM  Gender:Female  Living status:Living  Apgars:9 ,9  Weight:3025 g      Physical exam  Vitals:   04/26/20 0525 04/26/20 1509 04/26/20 2148 04/27/20 0525  BP: (!) 99/58 (!) 118/51 106/67 115/72  Pulse: 75 87 87 97  Resp: 17 17 16 17   Temp: 98.2 F (36.8 C) 98.5 F (36.9 C) 98.3 F (36.8 C) 97.6 F (36.4 C)  TempSrc: Oral Oral Oral Oral  SpO2: 98% 98% 98% 97%  Weight:      Height:       General: alert, cooperative and no distress Lochia: appropriate Uterine Fundus: firm Incision: Healing well with no significant drainage, No significant erythema DVT Evaluation: No evidence of DVT seen  on physical exam. Labs: Lab Results  Component Value Date   WBC 17.1 (H) 04/26/2020   HGB 7.9 (L) 04/26/2020   HCT 24.9 (L) 04/26/2020   MCV 87.1 04/26/2020   PLT 194 04/26/2020   No flowsheet data found. Edinburgh Score: Edinburgh Postnatal Depression Scale Screening Tool 04/26/2020  I have been able to laugh and see the funny side of things. (No Data)      After visit meds:  Allergies as of 04/27/2020      Reactions   Anesthetics, Halogenated Other (See Comments)   Family history of malignant hyperthermia (grandfather, brother, and first cousin)   Succinylcholine Other (See Comments)   Family history of malignant hyperthermia (grandfather, brother, and first cousin)      Medication List    TAKE these medications   ibuprofen 800 MG tablet Commonly known as: ADVIL Take 1 tablet (800 mg total) by mouth every 6 (six) hours as needed.   oxyCODONE 5 MG immediate release tablet Commonly known as: Oxy IR/ROXICODONE Take 1 tablet (5 mg total) by mouth every 4 (four) hours as needed for breakthrough pain.   prenatal multivitamin Tabs tablet Take 1 tablet daily at 12 noon by mouth.        Discharge home in stable condition Infant Feeding: Breast Infant Disposition:home with mother Discharge instruction: per After Visit Summary and Postpartum booklet. Activity:  Advance as tolerated. Pelvic rest for 6 weeks.  Diet: routine diet Anticipated Birth Control: bilateral salpingectomy Postpartum Appointment:4 weeks  Future Appointments:No future appointments. Follow up Visit:  Follow-up Information    Vanessa Kick, MD Follow up in 4 week(s).   Specialty: Obstetrics and Gynecology Contact information: [address] [place] [address] [phone]                   04/27/2020 [facility] Lars Masson, MD

## 2020-05-29 DIAGNOSIS — Z3482 Encounter for supervision of other normal pregnancy, second trimester: Secondary | ICD-10-CM | POA: Diagnosis not present

## 2020-05-29 DIAGNOSIS — Z3483 Encounter for supervision of other normal pregnancy, third trimester: Secondary | ICD-10-CM | POA: Diagnosis not present

## 2020-07-31 ENCOUNTER — Other Ambulatory Visit (HOSPITAL_COMMUNITY): Payer: Self-pay

## 2020-07-31 MED ORDER — CARESTART COVID-19 HOME TEST VI KIT
PACK | 0 refills | Status: DC
  Filled 2020-07-31: qty 4, 4d supply, fill #0

## 2020-09-04 ENCOUNTER — Other Ambulatory Visit (HOSPITAL_COMMUNITY): Payer: Self-pay

## 2020-09-04 MED ORDER — CARESTART COVID-19 HOME TEST VI KIT
PACK | 0 refills | Status: DC
  Filled 2020-09-04: qty 4, 4d supply, fill #0

## 2020-11-18 ENCOUNTER — Other Ambulatory Visit (HOSPITAL_COMMUNITY): Payer: Self-pay

## 2020-11-18 MED ORDER — CARESTART COVID-19 HOME TEST VI KIT
PACK | 0 refills | Status: DC
  Filled 2020-11-18: qty 2, 4d supply, fill #0

## 2021-02-27 ENCOUNTER — Other Ambulatory Visit (HOSPITAL_COMMUNITY): Payer: Self-pay

## 2021-02-27 DIAGNOSIS — L700 Acne vulgaris: Secondary | ICD-10-CM | POA: Diagnosis not present

## 2021-02-27 DIAGNOSIS — D2372 Other benign neoplasm of skin of left lower limb, including hip: Secondary | ICD-10-CM | POA: Diagnosis not present

## 2021-02-27 DIAGNOSIS — D2362 Other benign neoplasm of skin of left upper limb, including shoulder: Secondary | ICD-10-CM | POA: Diagnosis not present

## 2021-02-27 DIAGNOSIS — L570 Actinic keratosis: Secondary | ICD-10-CM | POA: Diagnosis not present

## 2021-02-27 DIAGNOSIS — L814 Other melanin hyperpigmentation: Secondary | ICD-10-CM | POA: Diagnosis not present

## 2021-02-27 MED ORDER — FLUOROURACIL 0.5 % EX CREA
TOPICAL_CREAM | CUTANEOUS | 0 refills | Status: DC
  Filled 2021-02-27 (×2): qty 30, 14d supply, fill #0

## 2021-02-28 ENCOUNTER — Other Ambulatory Visit (HOSPITAL_COMMUNITY): Payer: Self-pay

## 2021-02-28 MED ORDER — FLUOROURACIL 5 % EX CREA
TOPICAL_CREAM | CUTANEOUS | 0 refills | Status: DC
  Filled 2021-02-28: qty 40, 14d supply, fill #0

## 2021-03-01 ENCOUNTER — Other Ambulatory Visit (HOSPITAL_COMMUNITY): Payer: Self-pay

## 2021-03-05 ENCOUNTER — Other Ambulatory Visit (HOSPITAL_COMMUNITY): Payer: Self-pay

## 2021-05-22 ENCOUNTER — Telehealth: Payer: 59 | Admitting: Physician Assistant

## 2021-05-22 ENCOUNTER — Other Ambulatory Visit (HOSPITAL_COMMUNITY): Payer: Self-pay

## 2021-05-22 DIAGNOSIS — B9689 Other specified bacterial agents as the cause of diseases classified elsewhere: Secondary | ICD-10-CM | POA: Diagnosis not present

## 2021-05-22 DIAGNOSIS — H109 Unspecified conjunctivitis: Secondary | ICD-10-CM

## 2021-05-22 MED ORDER — POLYMYXIN B-TRIMETHOPRIM 10000-0.1 UNIT/ML-% OP SOLN
1.0000 [drp] | OPHTHALMIC | 0 refills | Status: DC
  Filled 2021-05-22: qty 10, 34d supply, fill #0

## 2021-05-22 NOTE — Progress Notes (Signed)

## 2021-08-21 DIAGNOSIS — J029 Acute pharyngitis, unspecified: Secondary | ICD-10-CM | POA: Diagnosis not present

## 2021-08-21 DIAGNOSIS — J020 Streptococcal pharyngitis: Secondary | ICD-10-CM | POA: Diagnosis not present

## 2021-11-08 ENCOUNTER — Other Ambulatory Visit (HOSPITAL_COMMUNITY): Payer: Self-pay

## 2021-11-08 DIAGNOSIS — Z124 Encounter for screening for malignant neoplasm of cervix: Secondary | ICD-10-CM | POA: Diagnosis not present

## 2021-11-08 DIAGNOSIS — Z01419 Encounter for gynecological examination (general) (routine) without abnormal findings: Secondary | ICD-10-CM | POA: Diagnosis not present

## 2021-11-08 LAB — RESULTS CONSOLE HPV: CHL HPV: NEGATIVE

## 2021-11-08 LAB — HM PAP SMEAR

## 2021-11-08 MED ORDER — NORGESTIMATE-ETH ESTRADIOL 0.25-35 MG-MCG PO TABS
ORAL_TABLET | ORAL | 4 refills | Status: DC
  Filled 2021-11-08: qty 84, 63d supply, fill #0
  Filled 2022-04-30: qty 84, 63d supply, fill #1

## 2021-12-20 DIAGNOSIS — Z1501 Genetic susceptibility to malignant neoplasm of breast: Secondary | ICD-10-CM | POA: Diagnosis not present

## 2021-12-20 DIAGNOSIS — Z1231 Encounter for screening mammogram for malignant neoplasm of breast: Secondary | ICD-10-CM | POA: Diagnosis not present

## 2021-12-20 LAB — HM MAMMOGRAPHY

## 2022-03-12 ENCOUNTER — Other Ambulatory Visit: Payer: Self-pay

## 2022-03-25 ENCOUNTER — Other Ambulatory Visit (HOSPITAL_COMMUNITY): Payer: Self-pay

## 2022-03-25 DIAGNOSIS — D2362 Other benign neoplasm of skin of left upper limb, including shoulder: Secondary | ICD-10-CM | POA: Diagnosis not present

## 2022-03-25 DIAGNOSIS — D225 Melanocytic nevi of trunk: Secondary | ICD-10-CM | POA: Diagnosis not present

## 2022-03-25 DIAGNOSIS — L570 Actinic keratosis: Secondary | ICD-10-CM | POA: Diagnosis not present

## 2022-03-25 DIAGNOSIS — L814 Other melanin hyperpigmentation: Secondary | ICD-10-CM | POA: Diagnosis not present

## 2022-03-25 MED ORDER — FLUOROURACIL 5 % EX CREA
TOPICAL_CREAM | CUTANEOUS | 0 refills | Status: AC
  Filled 2022-03-25: qty 40, 30d supply, fill #0

## 2022-03-25 MED ORDER — TRETINOIN 0.025 % EX CREA
TOPICAL_CREAM | CUTANEOUS | 2 refills | Status: AC
  Filled 2022-03-25: qty 20, 30d supply, fill #0

## 2022-04-18 ENCOUNTER — Other Ambulatory Visit: Payer: Self-pay | Admitting: Obstetrics and Gynecology

## 2022-04-18 DIAGNOSIS — Z1501 Genetic susceptibility to malignant neoplasm of breast: Secondary | ICD-10-CM

## 2022-04-29 DIAGNOSIS — Z8489 Family history of other specified conditions: Secondary | ICD-10-CM | POA: Insufficient documentation

## 2022-04-30 ENCOUNTER — Encounter: Payer: Self-pay | Admitting: Physician Assistant

## 2022-04-30 ENCOUNTER — Ambulatory Visit: Payer: Commercial Managed Care - PPO | Admitting: Physician Assistant

## 2022-04-30 VITALS — BP 100/60 | HR 81 | Temp 98.4°F | Ht 62.0 in | Wt 188.0 lb

## 2022-04-30 DIAGNOSIS — E559 Vitamin D deficiency, unspecified: Secondary | ICD-10-CM | POA: Diagnosis not present

## 2022-04-30 DIAGNOSIS — E669 Obesity, unspecified: Secondary | ICD-10-CM | POA: Diagnosis not present

## 2022-04-30 DIAGNOSIS — Z1159 Encounter for screening for other viral diseases: Secondary | ICD-10-CM

## 2022-04-30 DIAGNOSIS — R5383 Other fatigue: Secondary | ICD-10-CM

## 2022-04-30 DIAGNOSIS — Z0000 Encounter for general adult medical examination without abnormal findings: Secondary | ICD-10-CM

## 2022-04-30 DIAGNOSIS — Z136 Encounter for screening for cardiovascular disorders: Secondary | ICD-10-CM

## 2022-04-30 DIAGNOSIS — Z13220 Encounter for screening for lipoid disorders: Secondary | ICD-10-CM | POA: Diagnosis not present

## 2022-04-30 LAB — CBC WITH DIFFERENTIAL/PLATELET
Basophils Absolute: 0 10*3/uL (ref 0.0–0.1)
Basophils Relative: 0.6 % (ref 0.0–3.0)
Eosinophils Absolute: 0.1 10*3/uL (ref 0.0–0.7)
Eosinophils Relative: 2.2 % (ref 0.0–5.0)
HCT: 36.5 % (ref 36.0–46.0)
Hemoglobin: 12 g/dL (ref 12.0–15.0)
Lymphocytes Relative: 27.5 % (ref 12.0–46.0)
Lymphs Abs: 1.8 10*3/uL (ref 0.7–4.0)
MCHC: 32.8 g/dL (ref 30.0–36.0)
MCV: 85.8 fl (ref 78.0–100.0)
Monocytes Absolute: 0.4 10*3/uL (ref 0.1–1.0)
Monocytes Relative: 6 % (ref 3.0–12.0)
Neutro Abs: 4.2 10*3/uL (ref 1.4–7.7)
Neutrophils Relative %: 63.7 % (ref 43.0–77.0)
Platelets: 311 10*3/uL (ref 150.0–400.0)
RBC: 4.25 Mil/uL (ref 3.87–5.11)
RDW: 16.1 % — ABNORMAL HIGH (ref 11.5–15.5)
WBC: 6.7 10*3/uL (ref 4.0–10.5)

## 2022-04-30 LAB — COMPREHENSIVE METABOLIC PANEL
ALT: 16 U/L (ref 0–35)
AST: 16 U/L (ref 0–37)
Albumin: 3.9 g/dL (ref 3.5–5.2)
Alkaline Phosphatase: 54 U/L (ref 39–117)
BUN: 10 mg/dL (ref 6–23)
CO2: 27 mEq/L (ref 19–32)
Calcium: 9.3 mg/dL (ref 8.4–10.5)
Chloride: 102 mEq/L (ref 96–112)
Creatinine, Ser: 0.73 mg/dL (ref 0.40–1.20)
GFR: 105.6 mL/min (ref >60.00)
Glucose, Bld: 108 mg/dL — ABNORMAL HIGH (ref 70–99)
Potassium: 4 mEq/L (ref 3.5–5.1)
Sodium: 137 mEq/L (ref 135–145)
Total Bilirubin: 0.3 mg/dL (ref 0.2–1.2)
Total Protein: 7.1 g/dL (ref 6.0–8.3)

## 2022-04-30 LAB — VITAMIN B12: Vitamin B-12: 172 pg/mL — ABNORMAL LOW (ref 211–911)

## 2022-04-30 LAB — LIPID PANEL
Cholesterol: 193 mg/dL (ref 0–200)
HDL: 52.8 mg/dL (ref >39.00)
LDL Cholesterol: 112 mg/dL — ABNORMAL HIGH (ref 0–99)
NonHDL: 139.84
Total CHOL/HDL Ratio: 4
Triglycerides: 138 mg/dL (ref 0.0–149.0)
VLDL: 27.6 mg/dL (ref 0.0–40.0)

## 2022-04-30 LAB — TSH: TSH: 1.02 u[IU]/mL (ref 0.35–5.50)

## 2022-04-30 LAB — VITAMIN D 25 HYDROXY (VIT D DEFICIENCY, FRACTURES): VITD: 11.58 ng/mL — ABNORMAL LOW (ref 30.00–100.00)

## 2022-04-30 NOTE — Patient Instructions (Addendum)
It was great to see you!  Please work on aiming for 150 min of moderate exercise per week.  Please go to the lab for blood work.   Our office will call you with your results unless you have chosen to receive results via MyChart.  If your blood work is normal we will follow-up each year for physicals and as scheduled for chronic medical problems.  If anything is abnormal we will treat accordingly and get you in for a follow-up.  Take care,  Aldona Bar

## 2022-04-30 NOTE — Progress Notes (Signed)
Subjective:    Tiffany Barron is a 37 y.o. female and is here for a comprehensive physical exam.  HPI  Health Maintenance Due  Topic Date Due   Hepatitis C Screening  Never done   PAP SMEAR-Modifier  Never done    Acute Concerns: Fatigue Would like blood work checked She has 3 young children and works FT She is trying to get into regular exercise and healthy eating  Chronic Issues: BRCA2 She is currently getting mammograms and pelvic u/s through her gyn  Health Maintenance: Immunizations -- UTD Colonoscopy -- n/a Mammogram -- under regular care with Dr Harrington Challenger PAP -- UTD Diet -- eats all food groups; drinking less soda Exercise -- walking 30 min -- 3-4 times a week  Sleep habits -- overall good quality of sleep Mood -- occasional situational anxiety  UTD with dentist? - UTD UTD with eye doctor? - UTD;   Weight history: Wt Readings from Last 10 Encounters:  04/30/22 188 lb (85.3 kg)  04/25/20 188 lb (85.3 kg)  04/20/20 188 lb (85.3 kg)  08/07/17 146 lb 6.4 oz (66.4 kg)  02/02/17 187 lb (84.8 kg)   Body mass index is 34.39 kg/m. No LMP recorded.  Alcohol use:  reports current alcohol use of about 1.0 - 2.0 standard drink of alcohol per week.  Tobacco use:  Tobacco Use: Low Risk  (04/30/2022)   Patient History    Smoking Tobacco Use: Never    Smokeless Tobacco Use: Never    Passive Exposure: Not on file   Eligible for lung cancer screening? no     04/30/2022    9:03 AM  Depression screen PHQ 2/9  Decreased Interest 0  Down, Depressed, Hopeless 0  PHQ - 2 Score 0     Other providers/specialists: Patient Care Team: Inda Coke, [place] as PCP - General (Physician Assistant)    PMHx, SurgHx, SocialHx, Medications, and Allergies were reviewed in the Visit Navigator and updated as appropriate.   Past Medical History:  Diagnosis Date   BRCA2 positive    self + mother   Malignant hyperthermia    brother and first cousin   Medical history  non-contributory      Past Surgical History:  Procedure Laterality Date   CESAREAN SECTION N/A 02/03/2017   Procedure: CESAREAN SECTION;  Surgeon: Allyn Kenner, DO;  Location: [facility];  Service: Obstetrics;  Laterality: N/A;   CESAREAN SECTION WITH BILATERAL TUBAL LIGATION Bilateral 04/25/2020   Procedure: REPEAT CESAREAN SECTION WITH BILATERAL SALPINGECTOMY;  Surgeon: Vanessa Kick, MD;  Location: [facility];  Service: Obstetrics;  Laterality: Bilateral;   VAGINAL DELIVERY N/A 02/03/2017   Procedure: TWIN DOUBLE SET UP FOR VAGINAL DELIVERY;  Surgeon: Allyn Kenner, DO;  Location: [facility];  Service: Obstetrics;  Laterality: N/A;  twin double set up fo vag del     Family History  Problem Relation Age of Onset   Depression Mother    Ovarian cancer Mother    Depression Father    Alcohol abuse Father    Depression Brother    Malignant hyperthermia Brother    Depression Maternal Grandmother    Alcohol abuse Maternal Grandmother    Melanoma Maternal Grandmother    Birth defects Daughter    Asthma Daughter    Hypotonie Daughter    Malignant hyperthermia Cousin    Diabetes Neg Hx     Social History   Tobacco Use   Smoking status: Never   Smokeless tobacco: Never  Vaping  Use   Vaping Use: Never used  Substance Use Topics   Alcohol use: Yes    Alcohol/week: 1.0 - 2.0 standard drink of alcohol    Types: 1 - 2 Glasses of wine per week   Drug use: No    Review of Systems:   Review of Systems  Constitutional:  Negative for chills, fever, malaise/fatigue and weight loss.  HENT:  Negative for hearing loss, sinus pain and sore throat.   Respiratory:  Negative for cough and hemoptysis.   Cardiovascular:  Negative for chest pain, palpitations, leg swelling and PND.  Gastrointestinal:  Negative for abdominal pain, constipation, diarrhea, heartburn, nausea and vomiting.  Genitourinary:  Negative for dysuria, frequency and urgency.  Musculoskeletal:   Negative for back pain, myalgias and neck pain.  Skin:  Negative for itching and rash.  Neurological:  Negative for dizziness, tingling, seizures and headaches.  Endo/Heme/Allergies:  Negative for polydipsia.  Psychiatric/Behavioral:  Negative for depression. The patient is not nervous/anxious.     Objective:   BP 100/60 (BP Location: Left Arm, Patient Position: Sitting, Cuff Size: Large)   Pulse 81   Temp 98.4 F (36.9 C) (Temporal)   Ht 5' 2"$  (1.575 m)   Wt 188 lb (85.3 kg)   SpO2 98%   Breastfeeding No   BMI 34.39 kg/m  Body mass index is 34.39 kg/m.   General Appearance:    Alert, cooperative, no distress, appears stated age  Head:    Normocephalic, without obvious abnormality, atraumatic  Eyes:    PERRL, conjunctiva/corneas clear, EOM's intact, fundi    benign, both eyes  Ears:    Normal TM's and external ear canals, both ears  Nose:   Nares normal, septum midline, mucosa normal, no drainage    or sinus tenderness  Throat:   Lips, mucosa, and tongue normal; teeth and gums normal  Neck:   Supple, symmetrical, trachea midline, no adenopathy;    thyroid:  no enlargement/tenderness/nodules; no carotid   bruit or JVD  Back:     Symmetric, no curvature, ROM normal, no CVA tenderness  Lungs:     Clear to auscultation bilaterally, respirations unlabored  Chest Wall:    No tenderness or deformity   Heart:    Regular rate and rhythm, S1 and S2 normal, no murmur, rub or gallop  Breast Exam:    Deferred  Abdomen:     Soft, non-tender, bowel sounds active all four quadrants,    no masses, no organomegaly  Genitalia:    Deferred  Extremities:   Extremities normal, atraumatic, no cyanosis or edema  Pulses:   2+ and symmetric all extremities  Skin:   Skin color, texture, turgor normal, no rashes or lesions  Lymph nodes:   Cervical, supraclavicular, and axillary nodes normal  Neurologic:   CNII-XII intact, normal strength, sensation and reflexes    throughout    Assessment/Plan:    Routine physical examination Today patient counseled on age appropriate routine health concerns for screening and prevention, each reviewed and up to date or declined. Immunizations reviewed and up to date or declined. Labs ordered and reviewed. Risk factors for depression reviewed and negative. Hearing function and visual acuity are intact. ADLs screened and addressed as needed. Functional ability and level of safety reviewed and appropriate. Education, counseling and referrals performed based on assessed risks today. Patient provided with a copy of personalized plan for preventive services.  Encounter for lipid screening for cardiovascular disease Update lipid panel and make recommendations accordingly  Fatigue, unspecified type Update blood work to r/o organic cause Will provide recommendations on supplementation as indicated Continue healthy lifestyle measures Follow-up based on results and clinical sx  Vitamin D deficiency Update Vit D  Encounter for screening for other viral diseases Update Hep C  Obesity, unspecified classification, unspecified obesity type, unspecified whether serious comorbidity present Working on health diet and exercise as able   Inda Coke, PA-C [place]

## 2022-05-01 ENCOUNTER — Other Ambulatory Visit (INDEPENDENT_AMBULATORY_CARE_PROVIDER_SITE_OTHER): Payer: Commercial Managed Care - PPO

## 2022-05-01 DIAGNOSIS — R5383 Other fatigue: Secondary | ICD-10-CM | POA: Diagnosis not present

## 2022-05-01 DIAGNOSIS — E669 Obesity, unspecified: Secondary | ICD-10-CM

## 2022-05-01 LAB — IBC + FERRITIN
Ferritin: 5.3 ng/mL — ABNORMAL LOW (ref 10.0–291.0)
Iron: 42 ug/dL (ref 42–145)
Saturation Ratios: 8.3 % — ABNORMAL LOW (ref 20.0–50.0)
TIBC: 506.8 ug/dL — ABNORMAL HIGH (ref 250.0–450.0)
Transferrin: 362 mg/dL — ABNORMAL HIGH (ref 212.0–360.0)

## 2022-05-01 LAB — HEMOGLOBIN A1C: Hgb A1c MFr Bld: 5.9 % (ref 4.6–6.5)

## 2022-05-01 LAB — HEPATITIS C ANTIBODY: Hepatitis C Ab: NONREACTIVE

## 2022-05-03 ENCOUNTER — Encounter: Payer: Self-pay | Admitting: Physician Assistant

## 2022-05-03 ENCOUNTER — Other Ambulatory Visit (HOSPITAL_COMMUNITY): Payer: Self-pay

## 2022-05-03 MED ORDER — VITAMIN D (ERGOCALCIFEROL) 1.25 MG (50000 UNIT) PO CAPS
50000.0000 [IU] | ORAL_CAPSULE | ORAL | 0 refills | Status: DC
  Filled 2022-05-03: qty 12, 84d supply, fill #0

## 2022-05-07 ENCOUNTER — Encounter: Payer: Self-pay | Admitting: Physician Assistant

## 2022-05-09 ENCOUNTER — Ambulatory Visit (INDEPENDENT_AMBULATORY_CARE_PROVIDER_SITE_OTHER): Payer: Commercial Managed Care - PPO

## 2022-05-09 DIAGNOSIS — E538 Deficiency of other specified B group vitamins: Secondary | ICD-10-CM | POA: Diagnosis not present

## 2022-05-09 MED ORDER — CYANOCOBALAMIN 1000 MCG/ML IJ SOLN
1000.0000 ug | Freq: Once | INTRAMUSCULAR | Status: AC
  Administered 2022-05-09: 1000 ug via INTRAMUSCULAR

## 2022-05-09 NOTE — Progress Notes (Signed)
Pt in office for nurse visit due to vitamin B-12 deficiency. Administered 1000 mcg/mL Cyanocobalamin injection to patient in right deltoid and patient tolerated well with no issues.

## 2022-05-10 ENCOUNTER — Encounter: Payer: Self-pay | Admitting: Physician Assistant

## 2022-05-15 ENCOUNTER — Ambulatory Visit: Payer: Commercial Managed Care - PPO

## 2022-05-23 ENCOUNTER — Ambulatory Visit (INDEPENDENT_AMBULATORY_CARE_PROVIDER_SITE_OTHER): Payer: Commercial Managed Care - PPO

## 2022-05-23 DIAGNOSIS — E538 Deficiency of other specified B group vitamins: Secondary | ICD-10-CM

## 2022-05-23 MED ORDER — CYANOCOBALAMIN 1000 MCG/ML IJ SOLN
1000.0000 ug | Freq: Once | INTRAMUSCULAR | Status: AC
  Administered 2022-05-23: 1000 ug via INTRAMUSCULAR

## 2022-05-23 NOTE — Progress Notes (Signed)
Tiffany Barron 37 yr old female presents to office for 2ndof 4 weekly B12 injections per [name] . Administered CYANOBALAMIN 1,000 mcg IM right arm. Patient tolerated well.

## 2022-05-29 ENCOUNTER — Ambulatory Visit (INDEPENDENT_AMBULATORY_CARE_PROVIDER_SITE_OTHER): Payer: Commercial Managed Care - PPO | Admitting: *Deleted

## 2022-05-29 DIAGNOSIS — E538 Deficiency of other specified B group vitamins: Secondary | ICD-10-CM | POA: Diagnosis not present

## 2022-05-29 MED ORDER — CYANOCOBALAMIN 1000 MCG/ML IJ SOLN
1000.0000 ug | Freq: Once | INTRAMUSCULAR | Status: AC
  Administered 2022-05-29: 1000 ug via INTRAMUSCULAR

## 2022-05-29 NOTE — Progress Notes (Signed)
Patient presents to office for 4 th weekly B12 injections per [name] . Nyron Mozer, RN Administered CYANOBALAMIN 1,000 mcg IM right arm. Patient tolerated well.  pt will stop by the front desk to schedule a visit for her monthly dose.

## 2022-05-30 ENCOUNTER — Ambulatory Visit: Payer: Commercial Managed Care - PPO

## 2022-06-26 ENCOUNTER — Ambulatory Visit (INDEPENDENT_AMBULATORY_CARE_PROVIDER_SITE_OTHER): Payer: Commercial Managed Care - PPO

## 2022-06-26 DIAGNOSIS — E538 Deficiency of other specified B group vitamins: Secondary | ICD-10-CM

## 2022-06-26 MED ORDER — CYANOCOBALAMIN 1000 MCG/ML IJ SOLN
1000.0000 ug | Freq: Once | INTRAMUSCULAR | Status: AC
  Administered 2022-06-26: 1000 ug via INTRAMUSCULAR

## 2022-06-26 NOTE — Progress Notes (Signed)
Pt tolerated b12 injection well. 

## 2022-07-24 ENCOUNTER — Ambulatory Visit (INDEPENDENT_AMBULATORY_CARE_PROVIDER_SITE_OTHER): Payer: Commercial Managed Care - PPO

## 2022-07-24 DIAGNOSIS — Z0000 Encounter for general adult medical examination without abnormal findings: Secondary | ICD-10-CM

## 2022-07-24 DIAGNOSIS — E538 Deficiency of other specified B group vitamins: Secondary | ICD-10-CM | POA: Diagnosis not present

## 2022-07-24 MED ORDER — CYANOCOBALAMIN 1000 MCG/ML IJ SOLN
1000.0000 ug | Freq: Once | INTRAMUSCULAR | Status: AC
  Administered 2022-07-24: 1000 ug via INTRAMUSCULAR

## 2022-07-24 NOTE — Progress Notes (Signed)
Tiffany Barron 37 yr old female presents to office today for monthly B12 injection per Jarold Motto, PA. Administered CYANOCOBALAMIN 1,000 mcg IM right arm. Patient tolerated well.

## 2022-09-12 ENCOUNTER — Ambulatory Visit
Admission: RE | Admit: 2022-09-12 | Discharge: 2022-09-12 | Disposition: A | Discharge status: Home / Self Care | Payer: Commercial Managed Care - PPO | Source: Ambulatory Visit | Attending: Obstetrics and Gynecology | Admitting: Obstetrics and Gynecology

## 2022-09-12 DIAGNOSIS — Z1501 Genetic susceptibility to malignant neoplasm of breast: Secondary | ICD-10-CM

## 2022-09-12 DIAGNOSIS — Z1239 Encounter for other screening for malignant neoplasm of breast: Secondary | ICD-10-CM | POA: Diagnosis not present

## 2022-09-12 MED ORDER — GADOPICLENOL 0.5 MMOL/ML IV SOLN
8.0000 mL | Freq: Once | INTRAVENOUS | Status: AC | PRN
  Administered 2022-09-12: 8 mL via INTRAVENOUS

## 2022-09-27 ENCOUNTER — Telehealth: Payer: Self-pay | Admitting: Physician Assistant

## 2022-09-27 NOTE — Telephone Encounter (Signed)
Patient called to get scheduled for B12 injection but frequency is unclear. B12 injections dates are 2/22,3/7,3/13,4/10, and 5/8. Can we confirm if she needs to start B12 protocol over or continue once a month?

## 2022-09-30 NOTE — Telephone Encounter (Signed)
LVM to schedule nurse visit for B12

## 2022-09-30 NOTE — Telephone Encounter (Signed)
Please call pt and schedule Nurse visit for B12 injection x 2 months per [facility].

## 2022-09-30 NOTE — Telephone Encounter (Signed)
Please see message and advise if pt can continue B12 injection or have lab work done first?

## 2022-10-03 ENCOUNTER — Encounter: Payer: Self-pay | Admitting: *Deleted

## 2022-10-03 ENCOUNTER — Ambulatory Visit (INDEPENDENT_AMBULATORY_CARE_PROVIDER_SITE_OTHER): Payer: Commercial Managed Care - PPO | Admitting: *Deleted

## 2022-10-03 DIAGNOSIS — E538 Deficiency of other specified B group vitamins: Secondary | ICD-10-CM | POA: Diagnosis not present

## 2022-10-03 MED ORDER — CYANOCOBALAMIN 1000 MCG/ML IJ SOLN
1000.0000 ug | Freq: Once | INTRAMUSCULAR | Status: AC
  Administered 2022-10-03: 1000 ug via INTRAMUSCULAR

## 2022-10-03 NOTE — Progress Notes (Signed)
I have reviewed and agree with note, evaluation, plan.   Stephen Hunter, MD  

## 2022-10-03 NOTE — Progress Notes (Signed)
Per orders of Dr. Durene Cal, injection of Cyanocobalamin 1000 mcg given IM by Corky Mull, LPN in right deltoid. Patient tolerated injection well. Patient will make appointment for 1 month.

## 2022-12-11 DIAGNOSIS — Z01411 Encounter for gynecological examination (general) (routine) with abnormal findings: Secondary | ICD-10-CM | POA: Diagnosis not present

## 2022-12-11 DIAGNOSIS — Z1501 Genetic susceptibility to malignant neoplasm of breast: Secondary | ICD-10-CM | POA: Diagnosis not present

## 2022-12-23 DIAGNOSIS — Z803 Family history of malignant neoplasm of breast: Secondary | ICD-10-CM | POA: Diagnosis not present

## 2023-01-23 ENCOUNTER — Encounter: Payer: Self-pay | Admitting: Physician Assistant

## 2023-06-18 ENCOUNTER — Other Ambulatory Visit: Payer: Self-pay | Admitting: Obstetrics and Gynecology

## 2023-06-18 DIAGNOSIS — Z1501 Genetic susceptibility to malignant neoplasm of breast: Secondary | ICD-10-CM

## 2023-06-30 DIAGNOSIS — F4322 Adjustment disorder with anxiety: Secondary | ICD-10-CM | POA: Diagnosis not present

## 2023-07-15 NOTE — Progress Notes (Signed)
 Subjective:    Tiffany Barron is a 38 y.o. female and is here for a comprehensive physical exam.  HPI  Health Maintenance Due  Topic Date Due   COVID-19 Vaccine (3 - 2024-25 season) 11/17/2022    Acute Concerns: ***  Chronic Issues: ***  Health Maintenance: Immunizations -- *** Colonoscopy -- N/a Mammogram -- Last mammogram was done 12/20/2021. Results showed no mammographic evidence of malignancy. Bilateral breat MRI was done on 09/12/22 with results of no MRI evidenc of malignancy in either breast.  PAP -- UTD. Last done 11/08/2021. Results were NILM.  Bone Density -- N/a Diet -- *** Exercise -- ***  Sleep habits -- ***No concerns Mood -- ***Stable  UTD with dentist? - *** UTD with eye doctor? - ***  Weight history: Wt Readings from Last 10 Encounters:  04/30/22 188 lb (85.3 kg)  04/25/20 188 lb (85.3 kg)  04/20/20 188 lb (85.3 kg)  08/07/17 146 lb 6.4 oz (66.4 kg)  02/02/17 187 lb (84.8 kg)   There is no height or weight on file to calculate BMI. No LMP recorded.  Alcohol use:  reports current alcohol use of about 1.0 - 2.0 standard drink of alcohol per week.  Tobacco use:  Tobacco Use: Low Risk  (10/03/2022)   Patient History    Smoking Tobacco Use: Never    Smokeless Tobacco Use: Never    Passive Exposure: Not on file   Eligible for lung cancer screening? ***     04/30/2022    9:03 AM  Depression screen PHQ 2/9  Decreased Interest 0  Down, Depressed, Hopeless 0  PHQ - 2 Score 0     Other providers/specialists: Patient Care Team: Alexander Iba, [place] as PCP - General (Physician Assistant)    PMHx, SurgHx, SocialHx, Medications, and Allergies were reviewed in the Visit Navigator and updated as appropriate.   Past Medical History:  Diagnosis Date   BRCA2 positive    self + mother   Malignant hyperthermia    brother and first cousin   Medical history non-contributory      Past Surgical History:  Procedure Laterality Date   CESAREAN  SECTION N/A 02/03/2017   Procedure: CESAREAN SECTION;  Surgeon: Atlas Blank, DO;  [facility];  Service: Obstetrics;  Laterality: N/A;   CESAREAN SECTION WITH BILATERAL TUBAL LIGATION Bilateral 04/25/2020   Procedure: REPEAT CESAREAN SECTION WITH BILATERAL SALPINGECTOMY;  Surgeon: Reggy Capers, MD;  Location: [facility];  Service: Obstetrics;  Laterality: Bilateral;   VAGINAL DELIVERY N/A 02/03/2017   Procedure: TWIN DOUBLE SET UP FOR VAGINAL DELIVERY;  Surgeon: Atlas Blank, DO;  [facility]: [facility];  Service: Obstetrics;  Laterality: N/A;  twin double set up fo vag del     Family History  Problem Relation Age of Onset   Depression Mother    Ovarian cancer Mother    Depression Father    Alcohol abuse Father    Depression Brother    Malignant hyperthermia Brother    Depression Maternal Grandmother    Alcohol abuse Maternal Grandmother    Melanoma Maternal Grandmother    Birth defects Daughter    Asthma Daughter    Hypotonie Daughter    Malignant hyperthermia Cousin    Diabetes Neg Hx     Social History   Tobacco Use   Smoking status: Never   Smokeless tobacco: Never  Vaping Use   Vaping status: Never Used  Substance Use Topics   Alcohol use: Yes  Alcohol/week: 1.0 - 2.0 standard drink of alcohol    Types: 1 - 2 Glasses of wine per week   Drug use: No    Review of Systems:   ROS - Negative unless otherwise specified per HPI.   Objective:   There were no vitals taken for this visit. There is no height or weight on file to calculate BMI.   General Appearance:    Alert, cooperative, no distress, appears stated age  Head:    Normocephalic, without obvious abnormality, atraumatic  Eyes:    PERRL, conjunctiva/corneas clear, EOM's intact, fundi    benign, both eyes  Ears:    Normal TM's and external ear canals, both ears  Nose:   Nares normal, septum midline, mucosa normal, no drainage    or sinus tenderness  Throat:   Lips, mucosa,  and tongue normal; teeth and gums normal  Neck:   Supple, symmetrical, trachea midline, no adenopathy;    thyroid :  no enlargement/tenderness/nodules; no carotid   bruit or JVD  Back:     Symmetric, no curvature, ROM normal, no CVA tenderness  Lungs:     Clear to auscultation bilaterally, respirations unlabored  Chest Wall:    No tenderness or deformity   Heart:    Regular rate and rhythm, S1 and S2 normal, no murmur, rub or gallop  Breast Exam:    ***No tenderness, masses, or nipple abnormality  Abdomen:     Soft, non-tender, bowel sounds active all four quadrants,    no masses, no organomegaly  Genitalia:    ***Normal female without lesion, discharge or tenderness  Extremities:   Extremities normal, atraumatic, no cyanosis or edema  Pulses:   2+ and symmetric all extremities  Skin:   Skin color, texture, turgor normal, no rashes or lesions  Lymph nodes:   Cervical, supraclavicular, and axillary nodes normal  Neurologic:   CNII-XII intact, normal strength, sensation and reflexes    throughout    Assessment/Plan:   There are no diagnoses linked to this encounter.  I, Bernita Bristle, acting as a Neurosurgeon for Alexander Iba, [place]., have documented all relevant documentation on the behalf of Alexander Iba, [place], as directed by   while in the presence of Alexander Iba, [place].  I, Alexander Iba, [place], have reviewed all documentation for this visit. The documentation on 07/15/23 for the exam, diagnosis, procedures, and orders are all accurate and complete.  Alexander Iba, PA-C [facility]

## 2023-07-16 ENCOUNTER — Other Ambulatory Visit (HOSPITAL_COMMUNITY): Payer: Self-pay

## 2023-07-16 ENCOUNTER — Ambulatory Visit (INDEPENDENT_AMBULATORY_CARE_PROVIDER_SITE_OTHER): Admitting: Physician Assistant

## 2023-07-16 ENCOUNTER — Encounter: Payer: Self-pay | Admitting: Physician Assistant

## 2023-07-16 VITALS — BP 100/70 | HR 72 | Temp 97.3°F | Wt 158.0 lb

## 2023-07-16 DIAGNOSIS — D509 Iron deficiency anemia, unspecified: Secondary | ICD-10-CM | POA: Diagnosis not present

## 2023-07-16 DIAGNOSIS — E538 Deficiency of other specified B group vitamins: Secondary | ICD-10-CM

## 2023-07-16 DIAGNOSIS — E663 Overweight: Secondary | ICD-10-CM

## 2023-07-16 DIAGNOSIS — Z0000 Encounter for general adult medical examination without abnormal findings: Secondary | ICD-10-CM | POA: Diagnosis not present

## 2023-07-16 DIAGNOSIS — E88819 Insulin resistance, unspecified: Secondary | ICD-10-CM | POA: Diagnosis not present

## 2023-07-16 DIAGNOSIS — N924 Excessive bleeding in the premenopausal period: Secondary | ICD-10-CM

## 2023-07-16 DIAGNOSIS — F419 Anxiety disorder, unspecified: Secondary | ICD-10-CM

## 2023-07-16 DIAGNOSIS — Z13220 Encounter for screening for lipoid disorders: Secondary | ICD-10-CM | POA: Diagnosis not present

## 2023-07-16 DIAGNOSIS — E559 Vitamin D deficiency, unspecified: Secondary | ICD-10-CM | POA: Diagnosis not present

## 2023-07-16 LAB — CBC WITH DIFFERENTIAL/PLATELET
Basophils Absolute: 0 10*3/uL (ref 0.0–0.1)
Basophils Relative: 0.6 % (ref 0.0–3.0)
Eosinophils Absolute: 0.2 10*3/uL (ref 0.0–0.7)
Eosinophils Relative: 2.2 % (ref 0.0–5.0)
HCT: 40 % (ref 36.0–46.0)
Hemoglobin: 13.1 g/dL (ref 12.0–15.0)
Lymphocytes Relative: 25.1 % (ref 12.0–46.0)
Lymphs Abs: 2 10*3/uL (ref 0.7–4.0)
MCHC: 32.7 g/dL (ref 30.0–36.0)
MCV: 90.3 fl (ref 78.0–100.0)
Monocytes Absolute: 0.5 10*3/uL (ref 0.1–1.0)
Monocytes Relative: 6.4 % (ref 3.0–12.0)
Neutro Abs: 5.3 10*3/uL (ref 1.4–7.7)
Neutrophils Relative %: 65.7 % (ref 43.0–77.0)
Platelets: 290 10*3/uL (ref 150.0–400.0)
RBC: 4.43 Mil/uL (ref 3.87–5.11)
RDW: 16 % — ABNORMAL HIGH (ref 11.5–15.5)
WBC: 8.1 10*3/uL (ref 4.0–10.5)

## 2023-07-16 LAB — COMPREHENSIVE METABOLIC PANEL WITH GFR
ALT: 16 U/L (ref 0–35)
AST: 16 U/L (ref 0–37)
Albumin: 4.5 g/dL (ref 3.5–5.2)
Alkaline Phosphatase: 55 U/L (ref 39–117)
BUN: 9 mg/dL (ref 6–23)
CO2: 27 meq/L (ref 19–32)
Calcium: 9.4 mg/dL (ref 8.4–10.5)
Chloride: 102 meq/L (ref 96–112)
Creatinine, Ser: 0.71 mg/dL (ref 0.40–1.20)
GFR: 108.25 mL/min (ref >60.00)
Glucose, Bld: 92 mg/dL (ref 70–99)
Potassium: 4.1 meq/L (ref 3.5–5.1)
Sodium: 137 meq/L (ref 135–145)
Total Bilirubin: 0.5 mg/dL (ref 0.2–1.2)
Total Protein: 7.5 g/dL (ref 6.0–8.3)

## 2023-07-16 LAB — IBC + FERRITIN
Ferritin: 13.2 ng/mL (ref 10.0–291.0)
Iron: 58 ug/dL (ref 42–145)
Saturation Ratios: 15.9 % — ABNORMAL LOW (ref 20.0–50.0)
TIBC: 364 ug/dL (ref 250.0–450.0)
Transferrin: 260 mg/dL (ref 212.0–360.0)

## 2023-07-16 LAB — VITAMIN B12: Vitamin B-12: 352 pg/mL (ref 211–911)

## 2023-07-16 LAB — LIPID PANEL
Cholesterol: 167 mg/dL (ref 0–200)
HDL: 42.9 mg/dL (ref >39.00)
LDL Cholesterol: 112 mg/dL — ABNORMAL HIGH (ref 0–99)
NonHDL: 124.51
Total CHOL/HDL Ratio: 4
Triglycerides: 63 mg/dL (ref 0.0–149.0)
VLDL: 12.6 mg/dL (ref 0.0–40.0)

## 2023-07-16 LAB — VITAMIN D 25 HYDROXY (VIT D DEFICIENCY, FRACTURES): VITD: 14.56 ng/mL — ABNORMAL LOW (ref 30.00–100.00)

## 2023-07-16 LAB — HEMOGLOBIN A1C: Hgb A1c MFr Bld: 5.7 % (ref 4.6–6.5)

## 2023-07-16 MED ORDER — BUSPIRONE HCL 15 MG PO TABS
15.0000 mg | ORAL_TABLET | Freq: Three times a day (TID) | ORAL | 1 refills | Status: AC | PRN
  Filled 2023-07-16: qty 30, 10d supply, fill #0

## 2023-07-16 NOTE — Patient Instructions (Addendum)
 It was great to see you!  Consider magnesium  glycinate  Please go to the lab for blood work.   Our office will call you with your results unless you have chosen to receive results via MyChart.  If your blood work is normal we will follow-up each year for physicals and as scheduled for chronic medical problems.  If anything is abnormal we will treat accordingly and get you in for a follow-up.  Take care,  Tiffany Barron

## 2023-07-17 ENCOUNTER — Other Ambulatory Visit (HOSPITAL_COMMUNITY): Payer: Self-pay

## 2023-07-17 ENCOUNTER — Other Ambulatory Visit: Payer: Self-pay | Admitting: Physician Assistant

## 2023-07-17 ENCOUNTER — Encounter: Payer: Self-pay | Admitting: Physician Assistant

## 2023-07-17 MED ORDER — VITAMIN D (ERGOCALCIFEROL) 1.25 MG (50000 UNIT) PO CAPS
50000.0000 [IU] | ORAL_CAPSULE | ORAL | 0 refills | Status: AC
  Filled 2023-07-17: qty 12, 84d supply, fill #0

## 2023-07-28 ENCOUNTER — Other Ambulatory Visit (HOSPITAL_COMMUNITY): Payer: Self-pay

## 2023-07-30 DIAGNOSIS — F4322 Adjustment disorder with anxiety: Secondary | ICD-10-CM | POA: Diagnosis not present

## 2023-09-14 ENCOUNTER — Ambulatory Visit
Admission: RE | Admit: 2023-09-14 | Discharge: 2023-09-14 | Disposition: A | Discharge status: Home / Self Care | Source: Ambulatory Visit | Attending: Obstetrics and Gynecology | Admitting: Obstetrics and Gynecology

## 2023-09-14 DIAGNOSIS — Z1239 Encounter for other screening for malignant neoplasm of breast: Secondary | ICD-10-CM | POA: Diagnosis not present

## 2023-09-14 DIAGNOSIS — Z1501 Genetic susceptibility to malignant neoplasm of breast: Secondary | ICD-10-CM

## 2023-09-14 MED ORDER — GADOPICLENOL 0.5 MMOL/ML IV SOLN
7.5000 mL | Freq: Once | INTRAVENOUS | Status: AC | PRN
  Administered 2023-09-14: 7.5 mL via INTRAVENOUS

## 2023-09-29 DIAGNOSIS — F4322 Adjustment disorder with anxiety: Secondary | ICD-10-CM | POA: Diagnosis not present

## 2023-11-28 ENCOUNTER — Other Ambulatory Visit: Payer: Self-pay

## 2024-01-13 DIAGNOSIS — Z8481 Family history of carrier of genetic disease: Secondary | ICD-10-CM | POA: Diagnosis not present

## 2024-01-13 DIAGNOSIS — Z1589 Genetic susceptibility to other disease: Secondary | ICD-10-CM | POA: Diagnosis not present

## 2024-01-13 DIAGNOSIS — Z1509 Genetic susceptibility to other malignant neoplasm: Secondary | ICD-10-CM | POA: Diagnosis not present

## 2024-01-13 DIAGNOSIS — Z803 Family history of malignant neoplasm of breast: Secondary | ICD-10-CM | POA: Diagnosis not present

## 2024-01-13 DIAGNOSIS — Z1501 Genetic susceptibility to malignant neoplasm of breast: Secondary | ICD-10-CM | POA: Diagnosis not present

## 2024-01-13 DIAGNOSIS — Z15068 Genetic susceptibility to other malignant neoplasm of digestive system: Secondary | ICD-10-CM | POA: Diagnosis not present

## 2024-01-13 DIAGNOSIS — Z01419 Encounter for gynecological examination (general) (routine) without abnormal findings: Secondary | ICD-10-CM | POA: Diagnosis not present

## 2024-01-13 DIAGNOSIS — Z1507 Genetic susceptibility to malignant neoplasm of urinary tract: Secondary | ICD-10-CM | POA: Diagnosis not present

## 2024-01-13 DIAGNOSIS — Z3009 Encounter for other general counseling and advice on contraception: Secondary | ICD-10-CM | POA: Diagnosis not present

## 2024-01-19 ENCOUNTER — Other Ambulatory Visit: Payer: Self-pay | Admitting: Obstetrics and Gynecology

## 2024-01-19 DIAGNOSIS — R928 Other abnormal and inconclusive findings on diagnostic imaging of breast: Secondary | ICD-10-CM

## 2024-01-27 ENCOUNTER — Encounter: Payer: Self-pay | Admitting: Physician Assistant

## 2024-01-29 ENCOUNTER — Ambulatory Visit
Admission: RE | Admit: 2024-01-29 | Discharge: 2024-01-29 | Disposition: A | Discharge status: Home / Self Care | Source: Ambulatory Visit | Attending: Obstetrics and Gynecology | Admitting: Obstetrics and Gynecology

## 2024-01-29 ENCOUNTER — Ambulatory Visit
Admission: RE | Admit: 2024-01-29 | Discharge: 2024-01-29 | Disposition: A | Source: Ambulatory Visit | Attending: Obstetrics and Gynecology | Admitting: Obstetrics and Gynecology

## 2024-01-29 DIAGNOSIS — R928 Other abnormal and inconclusive findings on diagnostic imaging of breast: Secondary | ICD-10-CM | POA: Diagnosis not present

## 2024-01-29 DIAGNOSIS — N6489 Other specified disorders of breast: Secondary | ICD-10-CM | POA: Diagnosis not present

## 2024-02-16 ENCOUNTER — Encounter: Payer: Self-pay | Admitting: Physician Assistant

## 2024-02-16 ENCOUNTER — Other Ambulatory Visit (HOSPITAL_COMMUNITY): Payer: Self-pay

## 2024-02-16 ENCOUNTER — Ambulatory Visit: Admitting: Physician Assistant

## 2024-02-16 VITALS — BP 108/68 | HR 90 | Temp 98.1°F | Ht 62.0 in | Wt 146.2 lb

## 2024-02-16 DIAGNOSIS — J029 Acute pharyngitis, unspecified: Secondary | ICD-10-CM

## 2024-02-16 DIAGNOSIS — J209 Acute bronchitis, unspecified: Secondary | ICD-10-CM

## 2024-02-16 DIAGNOSIS — Z8751 Personal history of pre-term labor: Secondary | ICD-10-CM | POA: Insufficient documentation

## 2024-02-16 LAB — POCT RAPID STREP A (OFFICE): Rapid Strep A Screen: NEGATIVE

## 2024-02-16 LAB — POCT INFLUENZA A/B
Influenza A, POC: NEGATIVE
Influenza B, POC: NEGATIVE

## 2024-02-16 MED ORDER — AZITHROMYCIN 250 MG PO TABS
ORAL_TABLET | ORAL | 0 refills | Status: AC
  Filled 2024-02-16: qty 6, 5d supply, fill #0

## 2024-02-16 MED ORDER — BENZONATATE 200 MG PO CAPS
200.0000 mg | ORAL_CAPSULE | Freq: Two times a day (BID) | ORAL | 0 refills | Status: AC | PRN
  Filled 2024-02-16: qty 20, 10d supply, fill #0

## 2024-02-16 NOTE — Progress Notes (Signed)
 Patient ID: Tiffany Barron, female    DOB: Mar 08, 1986, 38 y.o.   MRN: [id number]   Assessment & Plan:  Acute bronchitis, unspecified organism -     POCT rapid strep A -     POCT Influenza A/B  Sore throat -     POCT rapid strep A -     POCT Influenza A/B  Other orders -     Azithromycin ; Take 2 tablets on day 1, then 1 tablet daily on days 2 through 5  Dispense: 6 tablet; Refill: 0 -     Benzonatate; Take 1 capsule (200 mg total) by mouth 2 (two) times daily as needed for cough.  Dispense: 20 capsule; Refill: 0     Assessment & Plan Acute bronchitis and acute pharyngitis Cough persisting for at least three weeks, initially presenting with sore throat and chest congestion. Negative rapid strep and flu tests. No fever. Symptoms suggestive of acute bronchitis with lingering cough, particularly at night, affecting sleep. No ear infections. Differential includes bronchitis given the duration and negative tests for bacterial causes. - Prescribed azithromycin  (Z-Pak) for potential bacterial component. - Prescribed Tessalon Perles for cough, up to three times a day. - Recommended Delsym for cough, as it does not cause drowsiness. - Advised using tea with honey to soothe throat.     F/up prn    Subjective:    Chief Complaint  Patient presents with   Cough    Pt has had cough for three weeks, but also has sore throat and kids have been sick as well. Pt is having a hard time at night sleeping with cough keeping her up all night. Denies seasonal allergies    Cough   Discussed the use of AI scribe software for clinical note transcription with the patient, who gave verbal consent to proceed.  History of Present Illness A 38 year old female presents with a persistent cough for three weeks.  She has been experiencing a persistent cough for at least three weeks, accompanied by a sore throat. Initially, the cough was heavy and tight in her chest but has improved somewhat, though it  remains persistent, especially at night, affecting her sleep. No fever, chills, or COVID-like symptoms have been noted. The cough began with symptoms resembling a cold, including a sore throat, cough, and some chest congestion. Minimal nasal drainage was present but manageable.  Her children have also been sick at home. She has taken over-the-counter medications such as Sudafed and Nyquil in the past few weeks, which provided some relief but caused drowsiness, impacting her ability to work and care for her children. She has not taken any medication today.  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     Past Medical History:  Diagnosis Date   BRCA2 positive    self + mother   Malignant hyperthermia    brother and first cousin   Medical history non-contributory     Past Surgical History:  Procedure Laterality Date   CESAREAN SECTION N/A 02/03/2017   Procedure: CESAREAN SECTION;  Surgeon: Lilton Legions, DO;  [facility];  Service: Obstetrics;  Laterality: N/A;   CESAREAN SECTION WITH BILATERAL TUBAL LIGATION Bilateral 04/25/2020   Procedure: REPEAT CESAREAN SECTION WITH BILATERAL SALPINGECTOMY;  Surgeon: Okey Leader, MD;  Location: [facility];  Service: Obstetrics;  Laterality: Bilateral;   VAGINAL DELIVERY N/A 02/03/2017   Procedure: LAVELLA  DOUBLE SET UP FOR VAGINAL DELIVERY;  Surgeon: Lilton Legions, DO;  [facility];  Service: Obstetrics;  Laterality: N/A;  twin double set up fo vag del    Family History  Problem Relation Age of Onset   Depression Mother    Ovarian cancer Mother    Depression Father    Alcohol abuse Father    Depression Brother    Malignant hyperthermia Brother    Depression Maternal Grandmother    Alcohol abuse Maternal Grandmother    Melanoma Maternal Grandmother    Birth defects Daughter    Asthma Daughter    Hypotonie Daughter     Malignant hyperthermia Cousin    Diabetes Neg Hx     Social History   Tobacco Use   Smoking status: Never   Smokeless tobacco: Never  Vaping Use   Vaping status: Never Used  Substance Use Topics   Alcohol use: Yes    Alcohol/week: 1.0 - 2.0 standard drink of alcohol    Types: 1 - 2 Glasses of wine per week   Drug use: No     Allergies  Allergen Reactions   Anesthetics, Halogenated Other (See Comments)    Family history of malignant hyperthermia (grandfather, brother, and first cousin)   Succinylcholine Other (See Comments)    Family history of malignant hyperthermia (grandfather, brother, and first cousin)    Review of Systems  Respiratory:  Positive for cough.    NEGATIVE UNLESS OTHERWISE INDICATED IN HPI      Objective:     BP 108/68 (BP Location: Left Arm, Patient Position: Sitting, Cuff Size: Normal)   Pulse 90   Temp 98.1 F (36.7 C) (Temporal)   Ht 5' 2 (1.575 m)   Wt 146 lb 3.2 oz (66.3 kg)   LMP 02/02/2024 (Approximate)   SpO2 99%   BMI 26.74 kg/m   Wt Readings from Last 3 Encounters:  02/16/24 146 lb 3.2 oz (66.3 kg)  07/16/23 158 lb (71.7 kg)  04/30/22 188 lb (85.3 kg)    BP Readings from Last 3 Encounters:  02/16/24 108/68  07/16/23 100/70  04/30/22 100/60     Physical Exam Vitals and nursing note reviewed.  Constitutional:      General: She is not in acute distress.    Appearance: Normal appearance. She is not ill-appearing.  HENT:     Head: Normocephalic.     Right Ear: Tympanic membrane, ear canal and external ear normal.     Left Ear: Tympanic membrane, ear canal and external ear normal.     Nose: Congestion present.     Mouth/Throat:     Mouth: Mucous membranes are moist.     Pharynx: No oropharyngeal exudate or posterior oropharyngeal erythema.  Eyes:     Extraocular Movements: Extraocular movements intact.     Conjunctiva/sclera: Conjunctivae normal.     Pupils: Pupils are equal, round, and reactive to light.   Cardiovascular:     Rate and Rhythm: Normal rate and regular rhythm.     Pulses: Normal pulses.     Heart sounds: Normal heart sounds. No murmur heard. Pulmonary:     Effort: Pulmonary effort is normal. No respiratory distress.     Breath sounds: Normal breath sounds. No wheezing.     Comments: Persistent coughing while in office Musculoskeletal:     Cervical back: Normal range of motion.  Skin:    General: Skin is warm.  Neurological:     Mental Status: She is alert and  oriented to person, place, and time.  Psychiatric:        Mood and Affect: Mood normal.        Behavior: Behavior normal.             Evin Loiseau M Ladavia Lindenbaum, PA-C

## 2024-02-17 ENCOUNTER — Ambulatory Visit: Admitting: Physician Assistant
